# Patient Record
Sex: Female | Born: 1950
Health system: Southern US, Community
[De-identification: ages and names within clinical notes are randomized; demographics above are authoritative.]

## PROBLEM LIST (undated history)

## (undated) DIAGNOSIS — J45909 Unspecified asthma, uncomplicated: Secondary | ICD-10-CM

## (undated) DIAGNOSIS — I10 Essential (primary) hypertension: Secondary | ICD-10-CM

## (undated) DIAGNOSIS — M109 Gout, unspecified: Secondary | ICD-10-CM

## (undated) DIAGNOSIS — M1990 Unspecified osteoarthritis, unspecified site: Secondary | ICD-10-CM

## (undated) DIAGNOSIS — J189 Pneumonia, unspecified organism: Secondary | ICD-10-CM

## (undated) HISTORY — DX: Unspecified osteoarthritis, unspecified site: M19.90

---

## 1986-11-27 ENCOUNTER — Encounter (INDEPENDENT_AMBULATORY_CARE_PROVIDER_SITE_OTHER): Payer: Self-pay | Admitting: *Deleted

## 2000-11-25 ENCOUNTER — Emergency Department (HOSPITAL_COMMUNITY): Admission: EM | Admit: 2000-11-25 | Discharge: 2000-11-25 | Payer: Self-pay | Admitting: Emergency Medicine

## 2000-11-25 ENCOUNTER — Encounter: Payer: Self-pay | Admitting: Emergency Medicine

## 2001-08-28 ENCOUNTER — Emergency Department (HOSPITAL_COMMUNITY): Admission: EM | Admit: 2001-08-28 | Discharge: 2001-08-28 | Payer: Self-pay | Admitting: Emergency Medicine

## 2001-08-28 ENCOUNTER — Encounter: Payer: Self-pay | Admitting: Emergency Medicine

## 2001-10-27 ENCOUNTER — Emergency Department (HOSPITAL_COMMUNITY): Admission: EM | Admit: 2001-10-27 | Discharge: 2001-10-27 | Payer: Self-pay | Admitting: Emergency Medicine

## 2002-01-01 ENCOUNTER — Encounter: Admission: RE | Admit: 2002-01-01 | Discharge: 2002-01-01 | Payer: Self-pay | Admitting: Family Medicine

## 2005-04-18 ENCOUNTER — Emergency Department (HOSPITAL_COMMUNITY): Admission: EM | Admit: 2005-04-18 | Discharge: 2005-04-18 | Payer: Self-pay | Admitting: Family Medicine

## 2006-11-01 ENCOUNTER — Emergency Department (HOSPITAL_COMMUNITY): Admission: EM | Admit: 2006-11-01 | Discharge: 2006-11-01 | Payer: Self-pay | Admitting: Emergency Medicine

## 2006-11-23 ENCOUNTER — Emergency Department (HOSPITAL_COMMUNITY): Admission: EM | Admit: 2006-11-23 | Discharge: 2006-11-23 | Payer: Self-pay | Admitting: Emergency Medicine

## 2007-01-17 ENCOUNTER — Emergency Department (HOSPITAL_COMMUNITY): Admission: EM | Admit: 2007-01-17 | Discharge: 2007-01-17 | Payer: Self-pay | Admitting: Family Medicine

## 2007-01-25 ENCOUNTER — Encounter (INDEPENDENT_AMBULATORY_CARE_PROVIDER_SITE_OTHER): Payer: Self-pay | Admitting: *Deleted

## 2007-01-29 ENCOUNTER — Emergency Department (HOSPITAL_COMMUNITY): Admission: EM | Admit: 2007-01-29 | Discharge: 2007-01-29 | Payer: Self-pay | Admitting: Emergency Medicine

## 2009-02-17 ENCOUNTER — Emergency Department (HOSPITAL_COMMUNITY): Admission: EM | Admit: 2009-02-17 | Discharge: 2009-02-17 | Payer: Self-pay | Admitting: Emergency Medicine

## 2010-04-25 ENCOUNTER — Emergency Department (HOSPITAL_COMMUNITY): Admission: EM | Admit: 2010-04-25 | Discharge: 2010-04-25 | Payer: Self-pay | Admitting: Emergency Medicine

## 2010-05-28 ENCOUNTER — Emergency Department (HOSPITAL_COMMUNITY): Admission: EM | Admit: 2010-05-28 | Discharge: 2010-05-28 | Payer: Self-pay | Admitting: Emergency Medicine

## 2011-03-09 LAB — POCT I-STAT, CHEM 8
BUN: 13 mg/dL (ref 6–23)
Creatinine, Ser: 0.9 mg/dL (ref 0.4–1.2)
Glucose, Bld: 124 mg/dL — ABNORMAL HIGH (ref 70–99)
HCT: 41 % (ref 36.0–46.0)
Hemoglobin: 13.9 g/dL (ref 12.0–15.0)
Potassium: 4.3 mEq/L (ref 3.5–5.1)
Sodium: 137 mEq/L (ref 135–145)

## 2011-10-09 ENCOUNTER — Emergency Department (INDEPENDENT_AMBULATORY_CARE_PROVIDER_SITE_OTHER)
Admission: EM | Admit: 2011-10-09 | Discharge: 2011-10-09 | Disposition: A | Discharge status: Home / Self Care | Payer: Self-pay | Source: Home / Self Care | Attending: Emergency Medicine | Admitting: Emergency Medicine

## 2011-10-09 ENCOUNTER — Encounter (HOSPITAL_COMMUNITY): Payer: Self-pay

## 2011-10-09 DIAGNOSIS — R51 Headache: Secondary | ICD-10-CM

## 2011-10-09 DIAGNOSIS — I10 Essential (primary) hypertension: Secondary | ICD-10-CM

## 2011-10-09 HISTORY — DX: Essential (primary) hypertension: I10

## 2011-10-09 MED ORDER — TRIAMTERENE-HCTZ 37.5-25 MG PO TABS: 1.0000 | ORAL_TABLET | Freq: Every day | ORAL | Status: DC

## 2011-10-09 MED ORDER — FUROSEMIDE 20 MG PO TABS: 20.0000 mg | ORAL_TABLET | Freq: Every day | ORAL | Status: DC

## 2011-10-09 MED ORDER — FUROSEMIDE 20 MG PO TABS: 20.0000 mg | ORAL_TABLET | Freq: Two times a day (BID) | ORAL | Status: DC

## 2011-10-09 MED ORDER — CLONIDINE HCL 0.1 MG PO TABS
0.1000 mg | ORAL_TABLET | Freq: Once | ORAL | Status: AC
  Administered 2011-10-09: 0.1 mg via ORAL

## 2011-10-09 MED ORDER — CLONIDINE HCL 0.1 MG PO TABS
ORAL_TABLET | ORAL | Status: AC
  Filled 2011-10-09: qty 1

## 2011-10-09 MED ORDER — TRAZODONE HCL 100 MG PO TABS: 100.0000 mg | ORAL_TABLET | Freq: Every day | ORAL | Status: DC

## 2011-10-09 MED ORDER — METOPROLOL TARTRATE 100 MG PO TABS: 100.0000 mg | ORAL_TABLET | Freq: Two times a day (BID) | ORAL | Status: DC

## 2011-10-09 NOTE — ED Notes (Signed)
Headache started 2 days ago .  Ran out of hypertension medications 2 days ago.  Denies any nausea or light sensitivity.

## 2011-10-09 NOTE — ED Provider Notes (Signed)
History     CSN: [id number] Arrival date & time: 10/09/2011  1:16 PM   First MD Initiated Contact with Patient 10/09/11 1332      Chief Complaint  Patient presents with  . Headache    2 days ago started.  Ran out of hypertension medications 2 days ago.      (Consider location/radiation/quality/duration/timing/severity/associated sxs/prior treatment) Patient is a 60 y.o. female presenting with headaches. The history is provided by the patient.  Headache The primary symptoms include headaches. Primary symptoms do not include loss of consciousness, altered mental status, dizziness, visual change, paresthesias, focal weakness, loss of sensation, memory loss, fever or nausea. The symptoms began 1 to 2 hours ago.  The headache is not associated with visual change, neck stiffness, paresthesias or weakness.  Additional symptoms include pain. Additional symptoms do not include neck stiffness or weakness. Medical issues also include hypertension.    Past Medical History  Diagnosis Date  . Hypertension     Past Surgical History  Procedure Date  . Cesarean section     History reviewed. No pertinent family history.  History  Substance Use Topics  . Smoking status: Never Smoker   . Smokeless tobacco: Never Used  . Alcohol Use: No    OB History    Grav Para Term Preterm Abortions TAB SAB Ect Mult Living                  Review of Systems  Constitutional: Negative.  Negative for fever.  HENT: Negative for neck stiffness.   Eyes: Negative for visual disturbance.  Respiratory: Negative for cough, chest tightness and shortness of breath.   Cardiovascular: Negative for chest pain, palpitations and leg swelling.  Gastrointestinal: Negative for nausea.  Neurological: Positive for headaches. Negative for dizziness, focal weakness, loss of consciousness, weakness, numbness and paresthesias.  Psychiatric/Behavioral: Negative for memory loss and altered mental status.    Allergies    Review of patient's allergies indicates no known allergies.  Home Medications   Current Outpatient Rx  Name Route Sig Dispense Refill  . ASPIRIN 325 MG PO TABS Oral Take 325 mg by mouth daily.      . FUROSEMIDE 20 MG PO TABS Oral Take 20 mg by mouth daily.      Marland Kitchen METOPROLOL TARTRATE 100 MG PO TABS Oral Take 100 mg by mouth 2 (two) times daily.      . TRAZODONE HCL 100 MG PO TABS Oral Take 100 mg by mouth at bedtime.      . TRIAMTERENE-HCTZ 37.5-25 MG PO TABS Oral Take 1 tablet by mouth daily.      . FUROSEMIDE 20 MG PO TABS Oral Take 1 tablet (20 mg total) by mouth 2 (two) times daily. 30 tablet 0  . METOPROLOL TARTRATE 100 MG PO TABS Oral Take 1 tablet (100 mg total) by mouth 2 (two) times daily. 60 tablet 0  . TRAZODONE HCL 100 MG PO TABS Oral Take 1 tablet (100 mg total) by mouth at bedtime. 30 tablet 0  . TRIAMTERENE-HCTZ 37.5-25 MG PO TABS Oral Take 1 each (1 tablet total) by mouth daily. 30 tablet 0    BP 136/83  Pulse 71  Temp(Src) 98.7 F (37.1 C) (Oral)  Resp 18  SpO2 100%  Physical Exam  Nursing note and vitals reviewed. Constitutional: She is oriented to person, place, and time. She appears well-developed and well-nourished. She does not appear ill. No distress.  Eyes: Pupils are equal, round, and reactive to light.  Neck: Normal range of motion.  Cardiovascular: Normal rate.   Pulmonary/Chest: Effort normal and breath sounds normal.  Neurological: She is alert and oriented to person, place, and time. No cranial nerve deficit. Coordination normal.  Skin: Skin is warm.    ED Course  Procedures (including critical care time)  Labs Reviewed - No data to display No results found.   1. Hypertension   2. Headache       MDM  Requesting medication refills- HA and mild nausea- off medicines x 2 days including metoprolol. No focal neurological abnormalities.        Jimmie Molly, MD 10/09/11 [phone]

## 2011-12-23 ENCOUNTER — Ambulatory Visit (INDEPENDENT_AMBULATORY_CARE_PROVIDER_SITE_OTHER): Payer: Self-pay

## 2011-12-23 DIAGNOSIS — I10 Essential (primary) hypertension: Secondary | ICD-10-CM

## 2011-12-26 ENCOUNTER — Encounter: Payer: Self-pay | Admitting: *Deleted

## 2012-05-14 ENCOUNTER — Other Ambulatory Visit: Payer: Self-pay | Admitting: Emergency Medicine

## 2012-06-21 ENCOUNTER — Ambulatory Visit: Payer: Self-pay | Admitting: Emergency Medicine

## 2012-06-21 ENCOUNTER — Ambulatory Visit: Payer: Self-pay

## 2012-06-21 VITALS — BP 142/82 | HR 72 | Temp 98.1°F | Resp 17 | Ht 64.0 in | Wt 226.0 lb

## 2012-06-21 DIAGNOSIS — M5430 Sciatica, unspecified side: Secondary | ICD-10-CM

## 2012-06-21 MED ORDER — CYCLOBENZAPRINE HCL 10 MG PO TABS: 10.0000 mg | ORAL_TABLET | Freq: Three times a day (TID) | ORAL | Status: AC | PRN

## 2012-06-21 MED ORDER — TRAMADOL HCL 50 MG PO TABS: 50.0000 mg | ORAL_TABLET | Freq: Four times a day (QID) | ORAL | Status: AC | PRN

## 2012-06-21 MED ORDER — NAPROXEN SODIUM 550 MG PO TABS: 550.0000 mg | ORAL_TABLET | Freq: Two times a day (BID) | ORAL | Status: DC
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
## 2012-06-21 NOTE — Patient Instructions (Signed)

## 2012-06-28 ENCOUNTER — Telehealth: Payer: Self-pay

## 2012-06-28 NOTE — Telephone Encounter (Signed)
Pt called back she is requesting refills on Metoprolol 100  Mg bid, Maxzide 37.5/25, Albuterol inhaler (not pro air it was too costly), Triamcinolone ointment and Lasix 20mg  bid please advise. I am pulling chart to put on PA desk for review

## 2012-06-28 NOTE — Telephone Encounter (Signed)
Pt is needing a refill on all her meds please call [phone]  dfb  Pharmacy is [facility]

## 2012-06-28 NOTE — Telephone Encounter (Signed)
I spoke to patient she is requesting Rx I need to know what she is in need of, she does not know but will call me back and let me know.

## 2012-06-29 MED ORDER — METOPROLOL TARTRATE 100 MG PO TABS: 100.0000 mg | ORAL_TABLET | Freq: Two times a day (BID) | ORAL | Status: DC

## 2012-06-29 MED ORDER — ALBUTEROL SULFATE HFA 108 (90 BASE) MCG/ACT IN AERS: 2.0000 | INHALATION_SPRAY | Freq: Four times a day (QID) | RESPIRATORY_TRACT | Status: DC | PRN

## 2012-06-29 MED ORDER — FUROSEMIDE 20 MG PO TABS: 20.0000 mg | ORAL_TABLET | Freq: Two times a day (BID) | ORAL | Status: DC

## 2012-06-29 MED ORDER — TRIAMCINOLONE ACETONIDE 0.5 % EX OINT: TOPICAL_OINTMENT | Freq: Two times a day (BID) | CUTANEOUS | Status: DC

## 2012-06-29 MED ORDER — TRIAMTERENE-HCTZ 37.5-25 MG PO TABS: 1.0000 | ORAL_TABLET | Freq: Every day | ORAL | Status: DC

## 2012-06-29 NOTE — Telephone Encounter (Signed)
Spoke with pt advised RX sent to pharmacy and she needs OV before she runs out. Pt understood

## 2012-06-29 NOTE — Telephone Encounter (Signed)
I sent meds over - she will need an ov before these run out.

## 2012-07-27 ENCOUNTER — Ambulatory Visit (INDEPENDENT_AMBULATORY_CARE_PROVIDER_SITE_OTHER): Payer: Self-pay | Admitting: Emergency Medicine

## 2012-07-27 VITALS — BP 162/104 | HR 75 | Temp 98.3°F | Resp 20 | Ht 64.0 in | Wt 227.4 lb

## 2012-07-27 DIAGNOSIS — M5430 Sciatica, unspecified side: Secondary | ICD-10-CM

## 2012-07-27 DIAGNOSIS — I10 Essential (primary) hypertension: Secondary | ICD-10-CM

## 2012-07-27 MED ORDER — FUROSEMIDE 20 MG PO TABS: 20.0000 mg | ORAL_TABLET | Freq: Two times a day (BID) | ORAL | Status: DC

## 2012-07-27 MED ORDER — ALBUTEROL SULFATE HFA 108 (90 BASE) MCG/ACT IN AERS: 2.0000 | INHALATION_SPRAY | Freq: Four times a day (QID) | RESPIRATORY_TRACT | Status: DC | PRN

## 2012-07-27 MED ORDER — CALCIUM CARBONATE 200 MG PO CAPS: 250.0000 mg | ORAL_CAPSULE | Freq: Two times a day (BID) | ORAL | Status: DC

## 2012-07-27 MED ORDER — TRIAMTERENE-HCTZ 37.5-25 MG PO TABS: 1.0000 | ORAL_TABLET | Freq: Every day | ORAL | Status: DC

## 2012-07-27 MED ORDER — TRAZODONE HCL 100 MG PO TABS: 100.0000 mg | ORAL_TABLET | Freq: Every day | ORAL | Status: DC

## 2012-07-27 MED ORDER — NAPROXEN SODIUM 550 MG PO TABS: 550.0000 mg | ORAL_TABLET | Freq: Two times a day (BID) | ORAL | Status: AC

## 2012-07-27 MED ORDER — ASPIRIN 325 MG PO TABS: 325.0000 mg | ORAL_TABLET | Freq: Every day | ORAL | Status: DC

## 2012-07-27 MED ORDER — OMEGA-3 FATTY ACIDS 1000 MG PO CAPS: 2.0000 g | ORAL_CAPSULE | Freq: Every day | ORAL | Status: DC

## 2012-07-27 MED ORDER — TRAMADOL HCL 50 MG PO TABS: 50.0000 mg | ORAL_TABLET | Freq: Four times a day (QID) | ORAL | Status: AC | PRN

## 2012-07-27 MED ORDER — TRIAMCINOLONE ACETONIDE 0.5 % EX OINT: TOPICAL_OINTMENT | Freq: Two times a day (BID) | CUTANEOUS | Status: AC

## 2012-07-27 MED ORDER — METOPROLOL TARTRATE 100 MG PO TABS: 100.0000 mg | ORAL_TABLET | Freq: Two times a day (BID) | ORAL | Status: DC

## 2012-07-27 NOTE — Progress Notes (Signed)
Date:  07/27/2012   Name:  Christine Porter   DOB:  04-05-1951   MRN:  [id number] Gender: female Age: 61 y.o.  PCP:  Elvina Sidle, MD    Chief Complaint: Hypertension   History of Present Illness:  Christine Porter is a 61 y.o. pleasant patient who presents with the following:  Just took her last dose of antihypertensive today.  Returned from [place] trip and thinks her BP is elevated as a consequence of the trip.  Denies symptoms, neurological or visual symptoms.  Has increased pain in sciatic distribution requiring naproxyn twice weekly  There is no problem list on file for this patient.   Past Medical History  Diagnosis Date  . Hypertension     Past Surgical History  Procedure Date  . Cesarean section     History  Substance Use Topics  . Smoking status: Never Smoker   . Smokeless tobacco: Never Used  . Alcohol Use: No    No family history on file.  No Known Allergies  Medication list has been reviewed and updated.  Current Outpatient Prescriptions on File Prior to Visit  Medication Sig Dispense Refill  . albuterol (PROVENTIL HFA;VENTOLIN HFA) 108 (90 BASE) MCG/ACT inhaler Inhale 2 puffs into the lungs every 6 (six) hours as needed for wheezing.  1 Inhaler  0  . aspirin 325 MG tablet Take 325 mg by mouth daily.        . calcium carbonate 200 MG capsule Take 250 mg by mouth 2 (two) times daily with a meal.      . fish oil-omega-3 fatty acids 1000 MG capsule Take 2 g by mouth daily.      . furosemide (LASIX) 20 MG tablet Take 1 tablet (20 mg total) by mouth 2 (two) times daily.  60 tablet  0  . metoprolol (LOPRESSOR) 100 MG tablet Take 1 tablet (100 mg total) by mouth 2 (two) times daily.  60 tablet  0  . naproxen sodium (ANAPROX DS) 550 MG tablet Take 1 tablet (550 mg total) by mouth 2 (two) times daily with a meal.  40 tablet  0  . traZODone (DESYREL) 100 MG tablet Take 100 mg by mouth at bedtime.        . triamcinolone ointment (KENALOG) 0.5 % Apply  topically 2 (two) times daily.  30 g  0  . triamterene-hydrochlorothiazide (MAXZIDE-25) 37.5-25 MG per tablet Take 1 each (1 tablet total) by mouth daily.  30 tablet  0  . DISCONTD: furosemide (LASIX) 20 MG tablet Take 1 tablet (20 mg total) by mouth daily.  30 tablet  0  . DISCONTD: furosemide (LASIX) 20 MG tablet Take 1 tablet (20 mg total) by mouth 2 (two) times daily.  30 tablet  0  . DISCONTD: metoprolol (LOPRESSOR) 100 MG tablet Take 100 mg by mouth 2 (two) times daily.        Marland Kitchen DISCONTD: metoprolol (LOPRESSOR) 100 MG tablet Take 1 tablet (100 mg total) by mouth 2 (two) times daily.  60 tablet  0  . DISCONTD: traZODone (DESYREL) 100 MG tablet Take 1 tablet (100 mg total) by mouth at bedtime.  30 tablet  0  . DISCONTD: triamterene-hydrochlorothiazide (MAXZIDE-25) 37.5-25 MG per tablet Take 1 each (1 tablet total) by mouth daily.  30 tablet  0  . DISCONTD: triamterene-hydrochlorothiazide (MAXZIDE-25) 37.5-25 MG per tablet Take 1 each (1 tablet total) by mouth daily.  30 tablet  0    Review of Systems:  As per  HPI, otherwise negative.    Physical Examination: Filed Vitals:   07/27/12 1248  BP: 162/104  Pulse: 75  Temp: 98.3 F (36.8 C)  Resp: 20   Filed Vitals:   07/27/12 1248  Height: 5\' 4"  (1.626 m)  Weight: 227 lb 6.4 oz (103.148 kg)   Body mass index is 39.03 kg/(m^2). Ideal Body Weight: Weight in (lb) to have BMI = 25: 145.3   GEN: WDWN, NAD, Non-toxic, A & O x 3 HEENT: Atraumatic, Normocephalic. Neck supple. No masses, No LAD. Ears and Nose: No external deformity. CV: RRR, No M/G/R. No JVD. No thrill. No extra heart sounds. PULM: CTA B, no wheezes, crackles, rhonchi. No retractions. No resp. distress. No accessory muscle use. ABD: S, NT, ND, +BS. No rebound. No HSM. EXTR: No c/c/e NEURO Normal gait.  .    Assessment and Plan: Hypertension Sciatic neuritis Refill meds Discussed MRI with patient and she refused  Carmelina Dane, MD

## 2014-05-19 ENCOUNTER — Emergency Department (HOSPITAL_COMMUNITY): Payer: Self-pay

## 2014-05-19 ENCOUNTER — Emergency Department (HOSPITAL_COMMUNITY)
Admission: EM | Admit: 2014-05-19 | Discharge: 2014-05-19 | Disposition: A | Discharge status: Home / Self Care | Payer: Self-pay | Attending: Emergency Medicine | Admitting: Emergency Medicine

## 2014-05-19 ENCOUNTER — Encounter (HOSPITAL_COMMUNITY): Payer: Self-pay | Admitting: Emergency Medicine

## 2014-05-19 DIAGNOSIS — I10 Essential (primary) hypertension: Secondary | ICD-10-CM | POA: Insufficient documentation

## 2014-05-19 DIAGNOSIS — IMO0002 Reserved for concepts with insufficient information to code with codable children: Secondary | ICD-10-CM | POA: Insufficient documentation

## 2014-05-19 DIAGNOSIS — Z79899 Other long term (current) drug therapy: Secondary | ICD-10-CM | POA: Insufficient documentation

## 2014-05-19 DIAGNOSIS — Z8701 Personal history of pneumonia (recurrent): Secondary | ICD-10-CM | POA: Insufficient documentation

## 2014-05-19 DIAGNOSIS — J45901 Unspecified asthma with (acute) exacerbation: Secondary | ICD-10-CM | POA: Insufficient documentation

## 2014-05-19 HISTORY — DX: Pneumonia, unspecified organism: J18.9

## 2014-05-19 LAB — BASIC METABOLIC PANEL
BUN: 11 mg/dL (ref 6–23)
CALCIUM: 9.4 mg/dL (ref 8.4–10.5)
CO2: 28 meq/L (ref 19–32)
Chloride: 105 mEq/L (ref 96–112)
Creatinine, Ser: 0.73 mg/dL (ref 0.50–1.10)
GFR calc Af Amer: >90 mL/min (ref >90)
GFR, EST NON AFRICAN AMERICAN: 88 mL/min — AB (ref >90)
Glucose, Bld: 90 mg/dL (ref 70–99)
Potassium: 3.8 mEq/L (ref 3.7–5.3)
SODIUM: 145 meq/L (ref 137–147)

## 2014-05-19 LAB — CBC
HCT: 33.6 % — ABNORMAL LOW (ref 36.0–46.0)
Hemoglobin: 11.1 g/dL — ABNORMAL LOW (ref 12.0–15.0)
MCH: 25.7 pg — ABNORMAL LOW (ref 26.0–34.0)
MCHC: 33 g/dL (ref 30.0–36.0)
MCV: 77.8 fL — ABNORMAL LOW (ref 78.0–100.0)
PLATELETS: 273 10*3/uL (ref 150–400)
RBC: 4.32 MIL/uL (ref 3.87–5.11)
RDW: 14.6 % (ref 11.5–15.5)
WBC: 5 10*3/uL (ref 4.0–10.5)

## 2014-05-19 MED ORDER — METOPROLOL TARTRATE 25 MG PO TABS
100.0000 mg | ORAL_TABLET | Freq: Once | ORAL | Status: AC
  Administered 2014-05-19: 100 mg via ORAL
  Filled 2014-05-19: qty 4

## 2014-05-19 MED ORDER — PREDNISONE 50 MG PO TABS: 50.0000 mg | ORAL_TABLET | Freq: Every day | ORAL | Status: DC

## 2014-05-19 MED ORDER — IPRATROPIUM BROMIDE 0.02 % IN SOLN
0.5000 mg | Freq: Once | RESPIRATORY_TRACT | Status: AC
  Administered 2014-05-19: 0.5 mg via RESPIRATORY_TRACT
  Filled 2014-05-19: qty 2.5

## 2014-05-19 MED ORDER — ALBUTEROL SULFATE (2.5 MG/3ML) 0.083% IN NEBU
5.0000 mg | INHALATION_SOLUTION | Freq: Once | RESPIRATORY_TRACT | Status: AC
  Administered 2014-05-19: 5 mg via RESPIRATORY_TRACT
  Filled 2014-05-19: qty 6

## 2014-05-19 MED ORDER — ALBUTEROL SULFATE (2.5 MG/3ML) 0.083% IN NEBU: 2.5000 mg | INHALATION_SOLUTION | Freq: Four times a day (QID) | RESPIRATORY_TRACT | Status: DC | PRN

## 2014-05-19 MED ORDER — SODIUM CHLORIDE 0.9 % IV BOLUS (SEPSIS)
500.0000 mL | Freq: Once | INTRAVENOUS | Status: AC
  Administered 2014-05-19: 500 mL via INTRAVENOUS

## 2014-05-19 MED ORDER — METHYLPREDNISOLONE SODIUM SUCC 125 MG IJ SOLR
125.0000 mg | Freq: Once | INTRAMUSCULAR | Status: AC
  Administered 2014-05-19: 125 mg via INTRAVENOUS
  Filled 2014-05-19: qty 2

## 2014-05-19 NOTE — ED Notes (Signed)
Pt transported to xray; xray department called and spoke with Brandy to let transporter know where to take pt back to

## 2014-05-19 NOTE — ED Provider Notes (Signed)
CSN: [id number]     Arrival date & time 05/19/14  1244 History   First MD Initiated Contact with Patient 05/19/14 1306     Chief Complaint  Patient presents with  . Shortness of Breath     (Consider location/radiation/quality/duration/timing/severity/associated sxs/prior Treatment) HPI Pt presents with c/o cough, shortness of breath.  Cough is productive of mucous.  No fever, but has had some subjective chills.  No chest pain, but does have some pain in her ribs with coughing. No leg swelling.  Pt has been using her albuterol inhaler at home without much relief.  Symptoms have been gradually worsening over the past several days.  No recent travel.  Pt denies smoking.  No specific sick contacts.  There are no other associated systemic symptoms, there are no other alleviating or modifying factors.   Past Medical History  Diagnosis Date  . Hypertension   . Pneumonia    Past Surgical History  Procedure Laterality Date  . Cesarean section     History reviewed. No pertinent family history. History  Substance Use Topics  . Smoking status: Never Smoker   . Smokeless tobacco: Never Used  . Alcohol Use: No   OB History   Grav Para Term Preterm Abortions TAB SAB Ect Mult Living                 Review of Systems ROS reviewed and all otherwise negative except for mentioned in HPI    Allergies  Codeine  Home Medications   Prior to Admission medications   Medication Sig Start Date End Date Taking? Authorizing Provider  albuterol (PROVENTIL HFA;VENTOLIN HFA) 108 (90 BASE) MCG/ACT inhaler Inhale 1 puff into the lungs every 6 (six) hours as needed for wheezing or shortness of breath.   Yes Historical Provider, MD  furosemide (LASIX) 40 MG tablet Take 40 mg by mouth daily as needed for fluid.    Yes Historical Provider, MD  metoprolol (LOPRESSOR) 100 MG tablet Take 100 mg by mouth 2 (two) times daily.   Yes Historical Provider, MD  traZODone (DESYREL) 100 MG tablet Take 1 tablet (100 mg  total) by mouth at bedtime. 07/27/12  Yes Ellison Carwin, MD  albuterol (PROVENTIL) (2.5 MG/3ML) 0.083% nebulizer solution Take 3 mLs (2.5 mg total) by nebulization every 6 (six) hours as needed for wheezing or shortness of breath. 05/19/14   Threasa Beards, MD  metoprolol (LOPRESSOR) 100 MG tablet Take 1 tablet (100 mg total) by mouth 2 (two) times daily. 07/27/12 07/27/13  Ellison Carwin, MD  predniSONE (DELTASONE) 50 MG tablet Take 1 tablet (50 mg total) by mouth daily. 05/19/14   Threasa Beards, MD   BP 193/91  Pulse 65  Temp(Src) 98.5 F (36.9 C) (Oral)  Resp 12  SpO2 100% Vitals reviewed Physical Exam Physical Examination: General appearance - alert, well appearing, and in no distress Mental status - alert, oriented to person, place, and time Eyes - no conjunctival injection, no scleral icterus Mouth - mucous membranes moist, pharynx normal without lesions Chest - symmetric air entry, bilateral expiratory wheezing, decreased air movement throughout, speaking in full sentences Heart - normal rate, regular rhythm, normal S1, S2, no murmurs, rubs, clicks or gallops Abdomen - soft, nontender, nondistended, no masses or organomegaly Extremities - peripheral pulses normal, no pedal edema, no clubbing or cyanosis Skin - normal coloration and turgor, no rashes  ED Course  Procedures (including critical care time)  2:53 PM pt feeling improved after neb treatment.  Still  has some very mild exp wheezes at bases.  Will give another duoneb.  Pt is requesting her home metoprolol dose as she has not taken that yet today.  This was ordered for her.   Labs Review Labs Reviewed  BASIC METABOLIC PANEL - Abnormal; Notable for the following:    GFR calc non Af Amer 88 (*)    All other components within normal limits  CBC - Abnormal; Notable for the following:    Hemoglobin 11.1 (*)    HCT 33.6 (*)    MCV 77.8 (*)    MCH 25.7 (*)    All other components within normal limits    Imaging  Review No results found.   EKG Interpretation None      MDM   Final diagnoses:  Asthma exacerbation  Essential hypertension    Pt presenting with wheezing, cough and shortness of breath.  Pt feels improved after 2 nebs in the ED.  Also started on steroids.  CXR does not show any pneumonia or other acute changes.   Xray images reviewed and interpreted by me as well.  Pt encouraged to use albuterol x4 hours, given rx for prednisone.  Discharged with strict return precautions.  Pt agreeable with plan.  Nursing notes including past medical history and social history reviewed and considered in documentation   Prior records reviewed and considered during this visit     Threasa Beards, MD 05/22/14 [phone]

## 2014-05-19 NOTE — ED Notes (Signed)
She states shes had a productive cough, chills, feeling SOB and pain in her ribs for past few days.

## 2014-05-19 NOTE — ED Notes (Signed)
Notified MD that pt's BP has been elevated. Pt missed home dose of metoprolol.

## 2014-05-19 NOTE — ED Notes (Signed)
Pt states she feels much better after receiving breathing treatment

## 2014-05-19 NOTE — ED Notes (Signed)
Pt still in radiology.

## 2014-05-19 NOTE — Discharge Instructions (Signed)
Return to the ED with any concerns including difficulty breathing despite using albuterol every 4 hours, not drinking fluids, decreased urine output, vomiting and not able to keep down liquids or medications, decreased level of alertness/lethargy, or any other alarming symptoms °

## 2014-05-19 NOTE — ED Notes (Signed)
Pt requesting a prescription for HTN. Denies currently taking anything at home for HTN.

## 2015-04-03 ENCOUNTER — Encounter (HOSPITAL_COMMUNITY): Payer: Self-pay | Admitting: *Deleted

## 2015-04-03 ENCOUNTER — Emergency Department (HOSPITAL_COMMUNITY)
Admission: EM | Admit: 2015-04-03 | Discharge: 2015-04-03 | Disposition: A | Discharge status: Home / Self Care | Payer: Medicare Other | Attending: Emergency Medicine | Admitting: Emergency Medicine

## 2015-04-03 ENCOUNTER — Other Ambulatory Visit: Payer: Self-pay

## 2015-04-03 DIAGNOSIS — Z8701 Personal history of pneumonia (recurrent): Secondary | ICD-10-CM | POA: Diagnosis not present

## 2015-04-03 DIAGNOSIS — Z7952 Long term (current) use of systemic steroids: Secondary | ICD-10-CM | POA: Insufficient documentation

## 2015-04-03 DIAGNOSIS — I10 Essential (primary) hypertension: Secondary | ICD-10-CM | POA: Diagnosis present

## 2015-04-03 DIAGNOSIS — Z79899 Other long term (current) drug therapy: Secondary | ICD-10-CM | POA: Insufficient documentation

## 2015-04-03 LAB — CBC WITH DIFFERENTIAL/PLATELET
Basophils Absolute: 0 10*3/uL (ref 0.0–0.1)
Basophils Relative: 0 % (ref 0–1)
Eosinophils Absolute: 0.1 10*3/uL (ref 0.0–0.7)
Eosinophils Relative: 1 % (ref 0–5)
HEMATOCRIT: 35.5 % — AB (ref 36.0–46.0)
HEMOGLOBIN: 11.7 g/dL — AB (ref 12.0–15.0)
LYMPHS ABS: 2.3 10*3/uL (ref 0.7–4.0)
Lymphocytes Relative: 35 % (ref 12–46)
MCH: 25.2 pg — ABNORMAL LOW (ref 26.0–34.0)
MCHC: 33 g/dL (ref 30.0–36.0)
MCV: 76.5 fL — AB (ref 78.0–100.0)
MONO ABS: 0.6 10*3/uL (ref 0.1–1.0)
MONOS PCT: 9 % (ref 3–12)
NEUTROS ABS: 3.7 10*3/uL (ref 1.7–7.7)
Neutrophils Relative %: 55 % (ref 43–77)
Platelets: 245 10*3/uL (ref 150–400)
RBC: 4.64 MIL/uL (ref 3.87–5.11)
RDW: 15 % (ref 11.5–15.5)
WBC: 6.6 10*3/uL (ref 4.0–10.5)

## 2015-04-03 LAB — URINE MICROSCOPIC-ADD ON

## 2015-04-03 LAB — COMPREHENSIVE METABOLIC PANEL
ALT: 9 U/L — ABNORMAL LOW (ref 14–54)
AST: 21 U/L (ref 15–41)
Albumin: 3.6 g/dL (ref 3.5–5.0)
Alkaline Phosphatase: 82 U/L (ref 38–126)
Anion gap: 6 (ref 5–15)
BILIRUBIN TOTAL: 0.6 mg/dL (ref 0.3–1.2)
BUN: 19 mg/dL (ref 6–20)
CHLORIDE: 100 mmol/L — AB (ref 101–111)
CO2: 27 mmol/L (ref 22–32)
Calcium: 9.3 mg/dL (ref 8.9–10.3)
Creatinine, Ser: 1.12 mg/dL — ABNORMAL HIGH (ref 0.44–1.00)
GFR, EST AFRICAN AMERICAN: 58 mL/min — AB (ref >60)
GFR, EST NON AFRICAN AMERICAN: 50 mL/min — AB (ref >60)
Glucose, Bld: 119 mg/dL — ABNORMAL HIGH (ref 70–99)
POTASSIUM: 3.2 mmol/L — AB (ref 3.5–5.1)
SODIUM: 133 mmol/L — AB (ref 135–145)
Total Protein: 7.1 g/dL (ref 6.5–8.1)

## 2015-04-03 LAB — URINALYSIS, ROUTINE W REFLEX MICROSCOPIC
GLUCOSE, UA: NEGATIVE mg/dL
Hgb urine dipstick: NEGATIVE
KETONES UR: 15 mg/dL — AB
Nitrite: NEGATIVE
PROTEIN: 30 mg/dL — AB
Specific Gravity, Urine: 1.018 (ref 1.005–1.030)
Urobilinogen, UA: 1 mg/dL (ref 0.0–1.0)
pH: 5 (ref 5.0–8.0)

## 2015-04-03 MED ORDER — POTASSIUM CHLORIDE ER 20 MEQ PO TBCR: EXTENDED_RELEASE_TABLET | ORAL | Status: DC

## 2015-04-03 MED ORDER — HYDROCHLOROTHIAZIDE 25 MG PO TABS: 25.0000 mg | ORAL_TABLET | Freq: Every day | ORAL | Status: DC

## 2015-04-03 MED ORDER — HYDROCHLOROTHIAZIDE 25 MG PO TABS
25.0000 mg | ORAL_TABLET | Freq: Every day | ORAL | Status: DC
  Administered 2015-04-03: 25 mg via ORAL
  Filled 2015-04-03: qty 1

## 2015-04-03 MED ORDER — SODIUM CHLORIDE 0.9 % IV BOLUS (SEPSIS)
1000.0000 mL | Freq: Once | INTRAVENOUS | Status: AC
  Administered 2015-04-03: 1000 mL via INTRAVENOUS

## 2015-04-03 NOTE — ED Notes (Signed)
Pt and daughter have several questions regarding concern of pts hypertension and discharge. This RN answered their questions to the best of my ability but they still request to speak with Dr. Lacinda Axon. Dr. Lacinda Axon and [name], PA informed. Magda Paganini to go back into room to answer questions and concerns.

## 2015-04-03 NOTE — ED Provider Notes (Signed)
CSN: [id number]     Arrival date & time 04/03/15  2006 History   First MD Initiated Contact with Patient 04/03/15 2042     Chief Complaint  Patient presents with  . Hypertension     (Consider location/radiation/quality/duration/timing/severity/associated sxs/prior Treatment) Patient is a 64 y.o. female presenting with hypertension. The history is provided by the patient. No language interpreter was used.  Hypertension This is a new problem. The current episode started today. The problem occurs constantly. The problem has been gradually worsening. Pertinent negatives include no abdominal pain or chest pain. Nothing aggravates the symptoms. She has tried nothing for the symptoms. The treatment provided moderate relief.  Pt complains of her blood pressure being high.  Pt reports she also thinks she is dehydrated.  Pt reports increased stress recently.  Pt takes metoprolol 100mg  bid  Past Medical History  Diagnosis Date  . Hypertension   . Pneumonia    Past Surgical History  Procedure Laterality Date  . Cesarean section     History reviewed. No pertinent family history. History  Substance Use Topics  . Smoking status: Never Smoker   . Smokeless tobacco: Never Used  . Alcohol Use: No   OB History    No data available     Review of Systems  Cardiovascular: Negative for chest pain.  Gastrointestinal: Negative for abdominal pain.  All other systems reviewed and are negative.     Allergies  Codeine  Home Medications   Prior to Admission medications   Medication Sig Start Date End Date Taking? Authorizing Provider  albuterol (PROVENTIL HFA;VENTOLIN HFA) 108 (90 BASE) MCG/ACT inhaler Inhale 1 puff into the lungs every 6 (six) hours as needed for wheezing or shortness of breath.    Historical Provider, MD  albuterol (PROVENTIL) (2.5 MG/3ML) 0.083% nebulizer solution Take 3 mLs (2.5 mg total) by nebulization every 6 (six) hours as needed for wheezing or shortness of breath.  05/19/14   Alfonzo Beers, MD  furosemide (LASIX) 40 MG tablet Take 40 mg by mouth daily as needed for fluid.     Historical Provider, MD  metoprolol (LOPRESSOR) 100 MG tablet Take 1 tablet (100 mg total) by mouth 2 (two) times daily. 07/27/12 07/27/13  Roselee Culver, MD  metoprolol (LOPRESSOR) 100 MG tablet Take 100 mg by mouth 2 (two) times daily.    Historical Provider, MD  predniSONE (DELTASONE) 50 MG tablet Take 1 tablet (50 mg total) by mouth daily. 05/19/14   Alfonzo Beers, MD  traZODone (DESYREL) 100 MG tablet Take 1 tablet (100 mg total) by mouth at bedtime. 07/27/12   Roselee Culver, MD   BP 205/91 mmHg  Pulse 76  Temp(Src) 98.4 F (36.9 C) (Oral)  Resp 20  Ht 5\' 4"  (1.626 m)  Wt 195 lb (88.451 kg)  BMI 33.46 kg/m2  SpO2 98% Physical Exam  Constitutional: She is oriented to person, place, and time. She appears well-developed and well-nourished.  HENT:  Head: Normocephalic and atraumatic.  Right Ear: External ear normal.  Left Ear: External ear normal.  Nose: Nose normal.  Mouth/Throat: Oropharynx is clear and moist.  Eyes: Conjunctivae and EOM are normal. Pupils are equal, round, and reactive to light.  Neck: Normal range of motion.  Cardiovascular: Normal rate and normal heart sounds.   Pulmonary/Chest: Effort normal.  Abdominal: Soft. She exhibits no distension.  Musculoskeletal: Normal range of motion.  Neurological: She is alert and oriented to person, place, and time.  Skin: Skin is warm.  Psychiatric:  She has a normal mood and affect.  Nursing note and vitals reviewed.   ED Course  Procedures (including critical care time) Labs Review Labs Reviewed - No data to display  Imaging Review No results found.   EKG Interpretation   Date/Time:  Saturday Apr 03 2015 20:21:40 EDT Ventricular Rate:  70 PR Interval:  136 QRS Duration: 80 QT Interval:  416 QTC Calculation: 449 R Axis:   25 Text Interpretation:  Normal sinus rhythm ST \\T \ T wave abnormality,   consider lateral ischemia Abnormal ECG Confirmed by COOK  MD, BRIAN  ([id number]) on 04/03/2015 10:00:21 PM      MDM  k 3.2  Creatine is 1,12  Bun normal.   Pt is given Iv fluids.   Pt had decreased blood pressure with hctz and relaxing.   Pt advised to follow up with her MD   Final diagnoses:  Essential hypertension    hctz rx Potassium rx Continue metoprolol. See Dt. Laurenstein for recheck next week.    Fransico Meadow, PA-C 04/03/15 [place], PA-C 04/03/15 [place], MD 04/04/15 [phone]

## 2015-04-03 NOTE — ED Notes (Signed)
Patient presents with c/o HTN  States she was checked on Thursday and it was up  States has been taking her BP meds   +lightheaded

## 2015-04-03 NOTE — Discharge Instructions (Signed)
DASH Eating Plan DASH stands for "Dietary Approaches to Stop Hypertension." The DASH eating plan is a healthy eating plan that has been shown to reduce high blood pressure (hypertension). Additional health benefits may include reducing the risk of type 2 diabetes mellitus, heart disease, and stroke. The DASH eating plan may also help with weight loss. WHAT DO I NEED TO KNOW ABOUT THE DASH EATING PLAN? For the DASH eating plan, you will follow these general guidelines:  Choose foods with a percent daily value for sodium of less than 5% (as listed on the food label).  Use salt-free seasonings or herbs instead of table salt or sea salt.  Check with your health care provider or pharmacist before using salt substitutes.  Eat lower-sodium products, often labeled as "lower sodium" or "no salt added."  Eat fresh foods.  Eat more vegetables, fruits, and low-fat dairy products.  Choose whole grains. Look for the word "whole" as the first word in the ingredient list.  Choose fish and skinless chicken or turkey more often than red meat. Limit fish, poultry, and meat to 6 oz (170 g) each day.  Limit sweets, desserts, sugars, and sugary drinks.  Choose heart-healthy fats.  Limit cheese to 1 oz (28 g) per day.  Eat more home-cooked food and less restaurant, buffet, and fast food.  Limit fried foods.  Cook foods using methods other than frying.  Limit canned vegetables. If you do use them, rinse them well to decrease the sodium.  When eating at a restaurant, ask that your food be prepared with less salt, or no salt if possible. WHAT FOODS CAN I EAT? Seek help from a dietitian for individual calorie needs. Grains Whole grain or whole wheat bread. Brown rice. Whole grain or whole wheat pasta. Quinoa, bulgur, and whole grain cereals. Low-sodium cereals. Corn or whole wheat flour tortillas. Whole grain cornbread. Whole grain crackers. Low-sodium crackers. Vegetables Fresh or frozen vegetables  (raw, steamed, roasted, or grilled). Low-sodium or reduced-sodium tomato and vegetable juices. Low-sodium or reduced-sodium tomato sauce and paste. Low-sodium or reduced-sodium canned vegetables.  Fruits All fresh, canned (in natural juice), or frozen fruits. Meat and Other Protein Products Ground beef (85% or leaner), grass-fed beef, or beef trimmed of fat. Skinless chicken or turkey. Ground chicken or turkey. Pork trimmed of fat. All fish and seafood. Eggs. Dried beans, peas, or lentils. Unsalted nuts and seeds. Unsalted canned beans. Dairy Low-fat dairy products, such as skim or 1% milk, 2% or reduced-fat cheeses, low-fat ricotta or cottage cheese, or plain low-fat yogurt. Low-sodium or reduced-sodium cheeses. Fats and Oils Tub margarines without trans fats. Light or reduced-fat mayonnaise and salad dressings (reduced sodium). Avocado. Safflower, olive, or canola oils. Natural peanut or almond butter. Other Unsalted popcorn and pretzels. The items listed above may not be a complete list of recommended foods or beverages. Contact your dietitian for more options. WHAT FOODS ARE NOT RECOMMENDED? Grains White bread. White pasta. White rice. Refined cornbread. Bagels and croissants. Crackers that contain trans fat. Vegetables Creamed or fried vegetables. Vegetables in a cheese sauce. Regular canned vegetables. Regular canned tomato sauce and paste. Regular tomato and vegetable juices. Fruits Dried fruits. Canned fruit in light or heavy syrup. Fruit juice. Meat and Other Protein Products Fatty cuts of meat. Ribs, chicken wings, bacon, sausage, bologna, salami, chitterlings, fatback, hot dogs, bratwurst, and packaged luncheon meats. Salted nuts and seeds. Canned beans with salt. Dairy Whole or 2% milk, cream, half-and-half, and cream cheese. Whole-fat or sweetened yogurt. Full-fat   cheeses or blue cheese. Nondairy creamers and whipped toppings. Processed cheese, cheese spreads, or cheese  curds. Condiments Onion and garlic salt, seasoned salt, table salt, and sea salt. Canned and packaged gravies. Worcestershire sauce. Tartar sauce. Barbecue sauce. Teriyaki sauce. Soy sauce, including reduced sodium. Steak sauce. Fish sauce. Oyster sauce. Cocktail sauce. Horseradish. Ketchup and mustard. Meat flavorings and tenderizers. Bouillon cubes. Hot sauce. Tabasco sauce. Marinades. Taco seasonings. Relishes. Fats and Oils Butter, stick margarine, lard, shortening, ghee, and bacon fat. Coconut, palm kernel, or palm oils. Regular salad dressings. Other Pickles and olives. Salted popcorn and pretzels. The items listed above may not be a complete list of foods and beverages to avoid. Contact your dietitian for more information. WHERE CAN I FIND MORE INFORMATION? National Heart, [employer], [employer]: www.nhlbi.nih.gov/health/health-topics/topics/dash/ Document Released: 11/02/2011 Document Revised: 03/30/2014 Document Reviewed: 09/17/2013 [employer] Patient Information 2015 [employer]. This information is not intended to replace advice given to you by your health care provider. Make sure you discuss any questions you have with your health care provider. Hypertension Hypertension, commonly called high blood pressure, is when the force of blood pumping through your arteries is too strong. Your arteries are the blood vessels that carry blood from your heart throughout your body. A blood pressure reading consists of a higher number over a lower number, such as 110/72. The higher number (systolic) is the pressure inside your arteries when your heart pumps. The lower number (diastolic) is the pressure inside your arteries when your heart relaxes. Ideally you want your blood pressure below 120/80. Hypertension forces your heart to work harder to pump blood. Your arteries may become narrow or stiff. Having hypertension puts you at risk for heart disease, stroke, and other problems.  RISK  FACTORS Some risk factors for high blood pressure are controllable. Others are not.  Risk factors you cannot control include:   Race. You may be at higher risk if you are African American.  Age. Risk increases with age.  Gender. Men are at higher risk than women before age 45 years. After age 65, women are at higher risk than men. Risk factors you can control include:  Not getting enough exercise or physical activity.  Being overweight.  Getting too much fat, sugar, calories, or salt in your diet.  Drinking too much alcohol. SIGNS AND SYMPTOMS Hypertension does not usually cause signs or symptoms. Extremely high blood pressure (hypertensive crisis) may cause headache, anxiety, shortness of breath, and nosebleed. DIAGNOSIS  To check if you have hypertension, your health care provider will measure your blood pressure while you are seated, with your arm held at the level of your heart. It should be measured at least twice using the same arm. Certain conditions can cause a difference in blood pressure between your right and left arms. A blood pressure reading that is higher than normal on one occasion does not mean that you need treatment. If one blood pressure reading is high, ask your health care provider about having it checked again. TREATMENT  Treating high blood pressure includes making lifestyle changes and possibly taking medicine. Living a healthy lifestyle can help lower high blood pressure. You may need to change some of your habits. Lifestyle changes may include:  Following the DASH diet. This diet is high in fruits, vegetables, and whole grains. It is low in salt, red meat, and added sugars.  Getting at least 2 hours of brisk physical activity every week.  Losing weight if necessary.  Not smoking.  Limiting   alcoholic beverages.  Learning ways to reduce stress. If lifestyle changes are not enough to get your blood pressure under control, your health care provider may  prescribe medicine. You may need to take more than one. Work closely with your health care provider to understand the risks and benefits. HOME CARE INSTRUCTIONS  Have your blood pressure rechecked as directed by your health care provider.   Take medicines only as directed by your health care provider. Follow the directions carefully. Blood pressure medicines must be taken as prescribed. The medicine does not work as well when you skip doses. Skipping doses also puts you at risk for problems.   Do not smoke.   Monitor your blood pressure at home as directed by your health care provider. SEEK MEDICAL CARE IF:   You think you are having a reaction to medicines taken.  You have recurrent headaches or feel dizzy.  You have swelling in your ankles.  You have trouble with your vision. SEEK IMMEDIATE MEDICAL CARE IF:  You develop a severe headache or confusion.  You have unusual weakness, numbness, or feel faint.  You have severe chest or abdominal pain.  You vomit repeatedly.  You have trouble breathing. MAKE SURE YOU:   Understand these instructions.  Will watch your condition.  Will get help right away if you are not doing well or get worse. Document Released: 11/13/2005 Document Revised: 03/30/2014 Document Reviewed: 09/05/2013 ExitCare Patient Information 2015 [employer]. This information is not intended to replace advice given to you by your health care provider. Make sure you discuss any questions you have with your health care provider.  

## 2015-04-03 NOTE — ED Notes (Signed)
Christine Porter at bedside addressing questions and concerns of patient and daughter. They state they feel much better about being discharged and thanking staff for her care. Pt A&OX4, ambulatory at d/c with steady gait, NAD but wheeled out to waiting room by this RN.

## 2015-04-03 NOTE — ED Notes (Signed)
Pt requesting BP medication prior to leaving. [name], [place] informed. Pts BP 175/95. No new orders.

## 2015-05-22 ENCOUNTER — Emergency Department (HOSPITAL_COMMUNITY)
Admission: EM | Admit: 2015-05-22 | Discharge: 2015-05-22 | Disposition: A | Discharge status: Home / Self Care | Payer: Medicare Other | Attending: Emergency Medicine | Admitting: Emergency Medicine

## 2015-05-22 ENCOUNTER — Encounter (HOSPITAL_COMMUNITY): Payer: Self-pay | Admitting: Physical Medicine and Rehabilitation

## 2015-05-22 DIAGNOSIS — Z8701 Personal history of pneumonia (recurrent): Secondary | ICD-10-CM | POA: Diagnosis not present

## 2015-05-22 DIAGNOSIS — Z760 Encounter for issue of repeat prescription: Secondary | ICD-10-CM | POA: Diagnosis not present

## 2015-05-22 DIAGNOSIS — Z7982 Long term (current) use of aspirin: Secondary | ICD-10-CM | POA: Diagnosis not present

## 2015-05-22 DIAGNOSIS — I10 Essential (primary) hypertension: Secondary | ICD-10-CM | POA: Diagnosis present

## 2015-05-22 DIAGNOSIS — Z79899 Other long term (current) drug therapy: Secondary | ICD-10-CM | POA: Insufficient documentation

## 2015-05-22 MED ORDER — HYDROCHLOROTHIAZIDE 25 MG PO TABS: 25.0000 mg | ORAL_TABLET | Freq: Every day | ORAL | Status: DC

## 2015-05-22 MED ORDER — METOPROLOL TARTRATE 100 MG PO TABS: 100.0000 mg | ORAL_TABLET | Freq: Two times a day (BID) | ORAL | Status: DC

## 2015-05-22 NOTE — ED Notes (Signed)
Pt reports she ran out of blood pressure medications x4 days ago. Reports elevated BP at home. Pt also reports headache. She is alert and oriented x4. NAD

## 2015-05-22 NOTE — Discharge Instructions (Signed)

## 2015-05-22 NOTE — ED Notes (Signed)
Pt reports she ran out of BP medications x4 days ago. Reports she is unable to see PCP, states headache at the time. Pt is alert and oriented x4. NAD

## 2015-05-26 NOTE — ED Provider Notes (Signed)
CSN: [id number]     Arrival date & time 05/22/15  3149 History   First MD Initiated Contact with Patient 05/22/15 1000     Chief Complaint  Patient presents with  . Hypertension     (Consider location/radiation/quality/duration/timing/severity/associated sxs/prior Treatment) HPI   64 year old female with hypertension. Resting prescriptions for her medications. Patient states that she sometimes gets headaches when her blood pressure is elevated, but denies currently. No chest pain or shortness of breath. No swelling. No urinary complaints. Patient has been without her blood pressure medications were partially the last 4 days.  Past Medical History  Diagnosis Date  . Hypertension   . Pneumonia    Past Surgical History  Procedure Laterality Date  . Cesarean section     No family history on file. History  Substance Use Topics  . Smoking status: Never Smoker   . Smokeless tobacco: Never Used  . Alcohol Use: No   OB History    No data available     Review of Systems  All systems reviewed and negative, other than as noted in HPI.   Allergies  Codeine  Home Medications   Prior to Admission medications   Medication Sig Start Date End Date Taking? Authorizing Provider  albuterol (PROVENTIL HFA;VENTOLIN HFA) 108 (90 BASE) MCG/ACT inhaler Inhale 1 puff into the lungs every 6 (six) hours as needed for wheezing or shortness of breath.    Historical Provider, MD  albuterol (PROVENTIL) (2.5 MG/3ML) 0.083% nebulizer solution Take 3 mLs (2.5 mg total) by nebulization every 6 (six) hours as needed for wheezing or shortness of breath. 05/19/14   Alfonzo Beers, MD  aspirin EC 325 MG tablet Take 325 mg by mouth daily.    Historical Provider, MD  furosemide (LASIX) 40 MG tablet Take 40 mg by mouth daily as needed for fluid.     Historical Provider, MD  hydrochlorothiazide (HYDRODIURIL) 25 MG tablet Take 1 tablet (25 mg total) by mouth daily. 05/22/15   Virgel Manifold, MD  metoprolol  (LOPRESSOR) 100 MG tablet Take 100 mg by mouth 2 (two) times daily.    Historical Provider, MD  metoprolol (LOPRESSOR) 100 MG tablet Take 1 tablet (100 mg total) by mouth 2 (two) times daily. 05/22/15 05/21/16  Virgel Manifold, MD  potassium chloride 20 MEQ TBCR One a day 04/03/15   Fransico Meadow, PA-C  predniSONE (DELTASONE) 50 MG tablet Take 1 tablet (50 mg total) by mouth daily. Patient not taking: Reported on 04/03/2015 05/19/14   Alfonzo Beers, MD  sertraline (ZOLOFT) 50 MG tablet Take 50 mg by mouth daily.    Historical Provider, MD  traZODone (DESYREL) 100 MG tablet Take 1 tablet (100 mg total) by mouth at bedtime. Patient not taking: Reported on 04/03/2015 07/27/12   Roselee Culver, MD   BP 192/99 mmHg  Pulse 99  Temp(Src) 98.2 F (36.8 C) (Oral)  Resp 18  SpO2 98% Physical Exam  Constitutional: She appears well-developed and well-nourished. No distress.  HENT:  Head: Normocephalic and atraumatic.  Eyes: Conjunctivae and EOM are normal. Pupils are equal, round, and reactive to light. Right eye exhibits no discharge. Left eye exhibits no discharge.  Neck: Neck supple.  Cardiovascular: Normal rate, regular rhythm and normal heart sounds.  Exam reveals no gallop and no friction rub.   No murmur heard. Pulmonary/Chest: Effort normal and breath sounds normal. No respiratory distress.  Abdominal: Soft. She exhibits no distension. There is no tenderness.  Musculoskeletal: She exhibits no edema or tenderness.  Lower extremities symmetric as compared to each other. No calf tenderness. Negative Homan's. No palpable cords.    Neurological: She is alert.  Skin: Skin is warm and dry.  Psychiatric: She has a normal mood and affect. Her behavior is normal. Thought content normal.  Nursing note and vitals reviewed.   ED Course  Procedures (including critical care time) Labs Review Labs Reviewed - No data to display  Imaging Review No results found.   EKG Interpretation None      MDM    Final diagnoses:  Essential hypertension  Medication refill    59 show female with asymptomatic hypertension. Prescriptions were provided. Needs to follow-up with PCP for further management. Return cautions were discussed.    Virgel Manifold, MD 05/26/15 [phone]

## 2015-08-22 ENCOUNTER — Emergency Department (HOSPITAL_COMMUNITY): Payer: Medicare Other

## 2015-08-22 ENCOUNTER — Emergency Department (HOSPITAL_COMMUNITY)
Admission: EM | Admit: 2015-08-22 | Discharge: 2015-08-22 | Disposition: A | Discharge status: Home / Self Care | Payer: Medicare Other | Attending: Emergency Medicine | Admitting: Emergency Medicine

## 2015-08-22 ENCOUNTER — Encounter (HOSPITAL_COMMUNITY): Payer: Self-pay | Admitting: Emergency Medicine

## 2015-08-22 DIAGNOSIS — Z8701 Personal history of pneumonia (recurrent): Secondary | ICD-10-CM | POA: Diagnosis not present

## 2015-08-22 DIAGNOSIS — I10 Essential (primary) hypertension: Secondary | ICD-10-CM | POA: Insufficient documentation

## 2015-08-22 DIAGNOSIS — Z79899 Other long term (current) drug therapy: Secondary | ICD-10-CM | POA: Insufficient documentation

## 2015-08-22 DIAGNOSIS — Z7982 Long term (current) use of aspirin: Secondary | ICD-10-CM | POA: Insufficient documentation

## 2015-08-22 DIAGNOSIS — R42 Dizziness and giddiness: Secondary | ICD-10-CM | POA: Diagnosis present

## 2015-08-22 LAB — COMPREHENSIVE METABOLIC PANEL
ALT: 7 U/L — ABNORMAL LOW (ref 14–54)
AST: 23 U/L (ref 15–41)
Albumin: 3.6 g/dL (ref 3.5–5.0)
Alkaline Phosphatase: 83 U/L (ref 38–126)
Anion gap: 7 (ref 5–15)
BUN: 9 mg/dL (ref 6–20)
CHLORIDE: 100 mmol/L — AB (ref 101–111)
CO2: 28 mmol/L (ref 22–32)
Calcium: 8.9 mg/dL (ref 8.9–10.3)
Creatinine, Ser: 0.86 mg/dL (ref 0.44–1.00)
GFR calc Af Amer: >60 mL/min (ref >60)
GFR calc non Af Amer: >60 mL/min (ref >60)
GLUCOSE: 87 mg/dL (ref 65–99)
POTASSIUM: 3.4 mmol/L — AB (ref 3.5–5.1)
Sodium: 135 mmol/L (ref 135–145)
Total Bilirubin: 0.7 mg/dL (ref 0.3–1.2)
Total Protein: 7.4 g/dL (ref 6.5–8.1)

## 2015-08-22 LAB — URINALYSIS, ROUTINE W REFLEX MICROSCOPIC
Bilirubin Urine: NEGATIVE
Glucose, UA: NEGATIVE mg/dL
Hgb urine dipstick: NEGATIVE
KETONES UR: NEGATIVE mg/dL
LEUKOCYTES UA: NEGATIVE
Nitrite: NEGATIVE
PH: 7 (ref 5.0–8.0)
Protein, ur: NEGATIVE mg/dL
SPECIFIC GRAVITY, URINE: 1.01 (ref 1.005–1.030)
Urobilinogen, UA: 1 mg/dL (ref 0.0–1.0)

## 2015-08-22 LAB — CBC WITH DIFFERENTIAL/PLATELET
Basophils Absolute: 0 10*3/uL (ref 0.0–0.1)
Basophils Relative: 1 %
Eosinophils Absolute: 0.1 10*3/uL (ref 0.0–0.7)
Eosinophils Relative: 2 %
HCT: 35.1 % — ABNORMAL LOW (ref 36.0–46.0)
Hemoglobin: 11.5 g/dL — ABNORMAL LOW (ref 12.0–15.0)
LYMPHS ABS: 1.9 10*3/uL (ref 0.7–4.0)
LYMPHS PCT: 44 %
MCH: 25.2 pg — AB (ref 26.0–34.0)
MCHC: 32.8 g/dL (ref 30.0–36.0)
MCV: 77 fL — AB (ref 78.0–100.0)
Monocytes Absolute: 0.3 10*3/uL (ref 0.1–1.0)
Monocytes Relative: 6 %
Neutro Abs: 2 10*3/uL (ref 1.7–7.7)
Neutrophils Relative %: 47 %
PLATELETS: 272 10*3/uL (ref 150–400)
RBC: 4.56 MIL/uL (ref 3.87–5.11)
RDW: 15.2 % (ref 11.5–15.5)
WBC: 4.3 10*3/uL (ref 4.0–10.5)

## 2015-08-22 MED ORDER — HYDROCHLOROTHIAZIDE 25 MG PO TABS
25.0000 mg | ORAL_TABLET | Freq: Every day | ORAL | Status: DC
  Administered 2015-08-22: 25 mg via ORAL
  Filled 2015-08-22: qty 1

## 2015-08-22 MED ORDER — SODIUM CHLORIDE 0.9 % IV BOLUS (SEPSIS)
1000.0000 mL | Freq: Once | INTRAVENOUS | Status: AC
  Administered 2015-08-22: 1000 mL via INTRAVENOUS

## 2015-08-22 MED ORDER — ONDANSETRON HCL 4 MG/2ML IJ SOLN
4.0000 mg | Freq: Once | INTRAMUSCULAR | Status: AC
  Administered 2015-08-22: 4 mg via INTRAVENOUS
  Filled 2015-08-22: qty 2

## 2015-08-22 MED ORDER — HYDROCHLOROTHIAZIDE 25 MG PO TABS: 25.0000 mg | ORAL_TABLET | Freq: Every day | ORAL | Status: DC

## 2015-08-22 MED ORDER — METOPROLOL TARTRATE 25 MG PO TABS
100.0000 mg | ORAL_TABLET | Freq: Once | ORAL | Status: AC
  Administered 2015-08-22: 100 mg via ORAL
  Filled 2015-08-22: qty 4

## 2015-08-22 MED ORDER — METOPROLOL TARTRATE 100 MG PO TABS: 100.0000 mg | ORAL_TABLET | Freq: Once | ORAL | Status: DC

## 2015-08-22 MED ORDER — POTASSIUM CHLORIDE CRYS ER 20 MEQ PO TBCR
40.0000 meq | EXTENDED_RELEASE_TABLET | Freq: Once | ORAL | Status: AC
  Administered 2015-08-22: 40 meq via ORAL
  Filled 2015-08-22: qty 2

## 2015-08-22 MED ORDER — ONDANSETRON 4 MG PO TBDP
4.0000 mg | ORAL_TABLET | Freq: Once | ORAL | Status: AC
  Administered 2015-08-22: 4 mg via ORAL
  Filled 2015-08-22: qty 1

## 2015-08-22 NOTE — ED Notes (Signed)
Pt. Stated, my BP  is up and Im so dizzy. This started yesterday.

## 2015-08-22 NOTE — Discharge Instructions (Signed)
You were evaluated in the ED today for your high blood pressure and dizziness. There is not appear to be an emergent cause for your symptoms at this time. Ear exam, labs and CT scan of her head were all reassuring. Please follow-up with your PCP in 2 days for reevaluation. Please take your blood pressure medications as prescribed. Return to ED for worsening symptoms.  Dizziness Dizziness is a common problem. It is a feeling of unsteadiness or light-headedness. You may feel like you are about to faint. Dizziness can lead to injury if you stumble or fall. A person of any age group can suffer from dizziness, but dizziness is more common in older adults. CAUSES  Dizziness can be caused by many different things, including:  Middle ear problems.  Standing for too long.  Infections.  An allergic reaction.  Aging.  An emotional response to something, such as the sight of blood.  Side effects of medicines.  Tiredness.  Problems with circulation or blood pressure.  Excessive use of alcohol or medicines, or illegal drug use.  Breathing too fast (hyperventilation).  An irregular heart rhythm (arrhythmia).  A low red blood cell count (anemia).  Pregnancy.  Vomiting, diarrhea, fever, or other illnesses that cause body fluid loss (dehydration).  Diseases or conditions such as Parkinson's disease, high blood pressure (hypertension), diabetes, and thyroid problems.  Exposure to extreme heat. DIAGNOSIS  Your health care provider will ask about your symptoms, perform a physical exam, and perform an electrocardiogram (ECG) to record the electrical activity of your heart. Your health care provider may also perform other heart or blood tests to determine the cause of your dizziness. These may include:  Transthoracic echocardiogram (TTE). During echocardiography, sound waves are used to evaluate how blood flows through your heart.  Transesophageal echocardiogram (TEE).  Cardiac monitoring.  This allows your health care provider to monitor your heart rate and rhythm in real time.  Holter monitor. This is a portable device that records your heartbeat and can help diagnose heart arrhythmias. It allows your health care provider to track your heart activity for several days if needed.  Stress tests by exercise or by giving medicine that makes the heart beat faster. TREATMENT  Treatment of dizziness depends on the cause of your symptoms and can vary greatly. HOME CARE INSTRUCTIONS   Drink enough fluids to keep your urine clear or pale yellow. This is especially important in very hot weather. In older adults, it is also important in cold weather.  Take your medicine exactly as directed if your dizziness is caused by medicines. When taking blood pressure medicines, it is especially important to get up slowly.  Rise slowly from chairs and steady yourself until you feel okay.  In the morning, first sit up on the side of the bed. When you feel okay, stand slowly while holding onto something until you know your balance is fine.  Move your legs often if you need to stand in one place for a long time. Tighten and relax your muscles in your legs while standing.  Have someone stay with you for 1-2 days if dizziness continues to be a problem. Do this until you feel you are well enough to stay alone. Have the person call your health care provider if he or she notices changes in you that are concerning.  Do not drive or use heavy machinery if you feel dizzy.  Do not drink alcohol. SEEK IMMEDIATE MEDICAL CARE IF:   Your dizziness or light-headedness gets  worse.  You feel nauseous or vomit.  You have problems talking, walking, or using your arms, hands, or legs.  You feel weak.  You are not thinking clearly or you have trouble forming sentences. It may take a friend or family member to notice this.  You have chest pain, abdominal pain, shortness of breath, or sweating.  Your vision  changes.  You notice any bleeding.  You have side effects from medicine that seems to be getting worse rather than better. MAKE SURE YOU:   Understand these instructions.  Will watch your condition.  Will get help right away if you are not doing well or get worse. Document Released: 05/09/2001 Document Revised: 11/18/2013 Document Reviewed: 06/02/2011 [facility] Patient Information 2015 [place], [place]. This information is not intended to replace advice given to you by your health care provider. Make sure you discuss any questions you have with your health care provider.  Managing Your High Blood Pressure Blood pressure is a measurement of how forceful your blood is pressing against the walls of the arteries. Arteries are muscular tubes within the circulatory system. Blood pressure does not stay the same. Blood pressure rises when you are active, excited, or nervous; and it lowers during sleep and relaxation. If the numbers measuring your blood pressure stay above normal most of the time, you are at risk for health problems. High blood pressure (hypertension) is a long-term (chronic) condition in which blood pressure is elevated. A blood pressure reading is recorded as two numbers, such as 120 over 80 (or 120/80). The first, higher number is called the systolic pressure. It is a measure of the pressure in your arteries as the heart beats. The second, lower number is called the diastolic pressure. It is a measure of the pressure in your arteries as the heart relaxes between beats.  Keeping your blood pressure in a normal range is important to your overall health and prevention of health problems, such as heart disease and stroke. When your blood pressure is uncontrolled, your heart has to work harder than normal. High blood pressure is a very common condition in adults because blood pressure tends to rise with age. Men and women are equally likely to have hypertension but at different times in life.  Before age 96, men are more likely to have hypertension. After 66 years of age, women are more likely to have it. Hypertension is especially common in African Americans. This condition often has no signs or symptoms. The cause of the condition is usually not known. Your caregiver can help you come up with a plan to keep your blood pressure in a normal, healthy range. BLOOD PRESSURE STAGES Blood pressure is classified into four stages: normal, prehypertension, stage 1, and stage 2. Your blood pressure reading will be used to determine what type of treatment, if any, is necessary. Appropriate treatment options are tied to these four stages:  Normal  Systolic pressure (mm Hg): below 120.  Diastolic pressure (mm Hg): below 80. Prehypertension  Systolic pressure (mm Hg): 120 to 139.  Diastolic pressure (mm Hg): 80 to 89. Stage1  Systolic pressure (mm Hg): 140 to 159.  Diastolic pressure (mm Hg): 90 to 99. Stage2  Systolic pressure (mm Hg): 160 or above.  Diastolic pressure (mm Hg): 100 or above. RISKS RELATED TO HIGH BLOOD PRESSURE Managing your blood pressure is an important responsibility. Uncontrolled high blood pressure can lead to:  A heart attack.  A stroke.  A weakened blood vessel (aneurysm).  Heart failure.  Kidney damage.  Eye damage.  Metabolic syndrome.  Memory and concentration problems. HOW TO MANAGE YOUR BLOOD PRESSURE Blood pressure can be managed effectively with lifestyle changes and medicines (if needed). Your caregiver will help you come up with a plan to bring your blood pressure within a normal range. Your plan should include the following: Education  Read all information provided by your caregivers about how to control blood pressure.  Educate yourself on the latest guidelines and treatment recommendations. New research is always being done to further define the risks and treatments for high blood pressure. Lifestylechanges  Control your  weight.  Avoid smoking.  Stay physically active.  Reduce the amount of salt in your diet.  Reduce stress.  Control any chronic conditions, such as high cholesterol or diabetes.  Reduce your alcohol intake. Medicines  Several medicines (antihypertensive medicines) are available, if needed, to bring blood pressure within a normal range. Communication  Review all the medicines you take with your caregiver because there may be side effects or interactions.  Talk with your caregiver about your diet, exercise habits, and other lifestyle factors that may be contributing to high blood pressure.  See your caregiver regularly. Your caregiver can help you create and adjust your plan for managing high blood pressure. RECOMMENDATIONS FOR TREATMENT AND FOLLOW-UP  The following recommendations are based on current guidelines for managing high blood pressure in nonpregnant adults. Use these recommendations to identify the proper follow-up period or treatment option based on your blood pressure reading. You can discuss these options with your caregiver.  Systolic pressure of 202 to 542 or diastolic pressure of 80 to 89: Follow up with your caregiver as directed.  Systolic pressure of 706 to 237 or diastolic pressure of 90 to 100: Follow up with your caregiver within 2 months.  Systolic pressure above 628 or diastolic pressure above 315: Follow up with your caregiver within 1 month.  Systolic pressure above 176 or diastolic pressure above 160: Consider antihypertensive therapy; follow up with your caregiver within 1 week.  Systolic pressure above 737 or diastolic pressure above 106: Begin antihypertensive therapy; follow up with your caregiver within 1 week. Document Released: 08/07/2012 Document Reviewed: 08/07/2012 [facility] Patient Information 2015 [place]. This information is not intended to replace advice given to you by your health care provider. Make sure you discuss any questions you  have with your health care provider.

## 2015-08-22 NOTE — ED Provider Notes (Signed)
CSN: [id number]     Arrival date & time 08/22/15  1050 History   First MD Initiated Contact with Patient 08/22/15 1124     Chief Complaint  Patient presents with  . Hypertension  . Dizziness     (Consider location/radiation/quality/duration/timing/severity/associated sxs/prior Treatment) HPI Christine Porter is a 64 y.o. female with history of hypertension, comes in for evaluation of high blood pressure and dizziness. Patient states she has been out of her blood pressure medications for "some time". She reports last night at approximately 9:00pm while cleaning up she began to feel dizzy and was unable to "put a key into the lock". Upon further questioning, patient admits it was dark outside, she used her flashlight to see the lock and was able to successfully put the key in the lock. She was able to go to sleep, but upon waking she still had the same dizzy sensation. She reports associated nausea without vomiting. Sensation is worse with standing as she will become slightly dizzy upon standing, but the sensation will resolve after a few seconds and she is able to walk without difficulty. She denies any associated headache, vision changes, chest pain, shortness of breath, abdominal pain. Reports that she is mildly dizzy now in the ED. Reports that she has a follow-up appointment with her PCP on Wednesday.  Past Medical History  Diagnosis Date  . Hypertension   . Pneumonia    Past Surgical History  Procedure Laterality Date  . Cesarean section     No family history on file. Social History  Substance Use Topics  . Smoking status: Never Smoker   . Smokeless tobacco: Never Used  . Alcohol Use: No   OB History    No data available     Review of Systems A 10 point review of systems was completed and was negative except for pertinent positives and negatives as mentioned in the history of present illness     Allergies  Codeine  Home Medications   Prior to Admission medications    Medication Sig Start Date End Date Taking? Authorizing Provider  albuterol (PROVENTIL HFA;VENTOLIN HFA) 108 (90 BASE) MCG/ACT inhaler Inhale 1 puff into the lungs every 6 (six) hours as needed for wheezing or shortness of breath.    Historical Provider, MD  albuterol (PROVENTIL) (2.5 MG/3ML) 0.083% nebulizer solution Take 3 mLs (2.5 mg total) by nebulization every 6 (six) hours as needed for wheezing or shortness of breath. 05/19/14   Alfonzo Beers, MD  aspirin EC 325 MG tablet Take 325 mg by mouth daily.    Historical Provider, MD  furosemide (LASIX) 40 MG tablet Take 40 mg by mouth daily as needed for fluid.     Historical Provider, MD  hydrochlorothiazide (HYDRODIURIL) 25 MG tablet Take 1 tablet (25 mg total) by mouth daily. 05/22/15   Virgel Manifold, MD  hydrochlorothiazide (HYDRODIURIL) 25 MG tablet Take 1 tablet (25 mg total) by mouth daily. 08/22/15   Comer Locket, PA-C  metoprolol (LOPRESSOR) 100 MG tablet Take 100 mg by mouth 2 (two) times daily.    Historical Provider, MD  metoprolol (LOPRESSOR) 100 MG tablet Take 1 tablet (100 mg total) by mouth 2 (two) times daily. 05/22/15 05/21/16  Virgel Manifold, MD  metoprolol tartrate (LOPRESSOR) 100 MG tablet Take 1 tablet (100 mg total) by mouth once. 08/22/15   Comer Locket, PA-C  potassium chloride 20 MEQ TBCR One a day 04/03/15   Fransico Meadow, PA-C  predniSONE (DELTASONE) 50 MG tablet Take 1  tablet (50 mg total) by mouth daily. Patient not taking: Reported on 04/03/2015 05/19/14   Alfonzo Beers, MD  sertraline (ZOLOFT) 50 MG tablet Take 50 mg by mouth daily.    Historical Provider, MD  traZODone (DESYREL) 100 MG tablet Take 1 tablet (100 mg total) by mouth at bedtime. Patient not taking: Reported on 04/03/2015 07/27/12   Roselee Culver, MD   BP 179/98 mmHg  Pulse 66  Temp(Src) 98.1 F (36.7 C) (Oral)  Resp 17  Ht 5\' 4"  (1.626 m)  Wt 194 lb 4 oz (88.111 kg)  BMI 33.33 kg/m2  SpO2 100% Physical Exam  Constitutional: She is oriented to  person, place, and time. She appears well-developed and well-nourished.  HENT:  Head: Normocephalic and atraumatic.  Mouth/Throat: Oropharynx is clear and moist.  Eyes: Conjunctivae are normal. Pupils are equal, round, and reactive to light. Right eye exhibits no discharge. Left eye exhibits no discharge. No scleral icterus.  Extraocular movements intact without nystagmus.  Neck: Neck supple.  Cardiovascular: Normal rate, regular rhythm and normal heart sounds.   Pulmonary/Chest: Effort normal and breath sounds normal. No respiratory distress. She has no wheezes. She has no rales.  Abdominal: Soft. There is no tenderness.  Musculoskeletal: She exhibits no tenderness.  Neurological: She is alert and oriented to person, place, and time.  Cranial Nerves II-XII grossly intact. Motor strength is 5/5 in all 4 extremities. Sensation intact to light touch. Completes finger to nose and heel to shin coordination movements without difficulty.  Skin: Skin is warm and dry. No rash noted.  Psychiatric: She has a normal mood and affect.  Nursing note and vitals reviewed.   ED Course  Procedures (including critical care time) Labs Review Labs Reviewed  COMPREHENSIVE METABOLIC PANEL - Abnormal; Notable for the following:    Potassium 3.4 (*)    Chloride 100 (*)    ALT 7 (*)    All other components within normal limits  CBC WITH DIFFERENTIAL/PLATELET - Abnormal; Notable for the following:    Hemoglobin 11.5 (*)    HCT 35.1 (*)    MCV 77.0 (*)    MCH 25.2 (*)    All other components within normal limits  URINALYSIS, ROUTINE W REFLEX MICROSCOPIC (NOT AT [facility])    Imaging Review Ct Head Wo Contrast  08/22/2015   CLINICAL DATA:  64 year old female with history of dizziness for 1 day.  EXAM: CT HEAD WITHOUT CONTRAST  TECHNIQUE: Contiguous axial images were obtained from the base of the skull through the vertex without intravenous contrast.  COMPARISON:  Head CT 01/29/2007.  FINDINGS: No acute  intracranial abnormalities. Specifically, no evidence of acute intracranial hemorrhage, no definite findings of acute/subacute cerebral ischemia, no mass, mass effect, hydrocephalus or abnormal intra or extra-axial fluid collections. Visualized paranasal sinuses and mastoids are well pneumatized. No acute displaced skull fractures are identified.  IMPRESSION: *No acute intracranial abnormalities. *The appearance of the brain is normal.   Electronically Signed   By: Vinnie Langton M.D.   On: 08/22/2015 13:34   I have personally reviewed and evaluated these images and lab results as part of my medical decision-making.   EKG Interpretation None       Meds given in ED:  Medications  hydrochlorothiazide (HYDRODIURIL) tablet 25 mg (25 mg Oral Given 08/22/15 1343)  ondansetron (ZOFRAN-ODT) disintegrating tablet 4 mg (4 mg Oral Given 08/22/15 1211)  sodium chloride 0.9 % bolus 1,000 mL (0 mLs Intravenous Stopped 08/22/15 1601)  ondansetron (ZOFRAN) injection 4  mg (4 mg Intravenous Given 08/22/15 1334)  potassium chloride SA (K-DUR,KLOR-CON) CR tablet 40 mEq (40 mEq Oral Given 08/22/15 1339)  metoprolol tartrate (LOPRESSOR) tablet 100 mg (100 mg Oral Given 08/22/15 1335)    Discharge Medication List as of 08/22/2015  3:52 PM    START taking these medications   Details  !! hydrochlorothiazide (HYDRODIURIL) 25 MG tablet Take 1 tablet (25 mg total) by mouth daily., Starting 08/22/2015, Until Discontinued, Print    !! metoprolol tartrate (LOPRESSOR) 100 MG tablet Take 1 tablet (100 mg total) by mouth once., Starting 08/22/2015, Print     !! - Potential duplicate medications found. Please discuss with provider.     Filed Vitals:   08/22/15 1345 08/22/15 1400 08/22/15 1445 08/22/15 1601  BP: 178/92 182/83 162/88 179/98  Pulse: 62 64 63 66  Temp:      TempSrc:      Resp: 19 16 20 17   Height:      Weight:      SpO2:  100% 99% 100%    MDM  Vitals stable  -afebrile Blood pressure treated in the ED  with patient's home meds of HCTZ and metoprolol. Pt resting comfortably in ED. PE--physical exam as above. Normal neuro exam. Some evidence of dehydration on exam, treated with 1 L normal saline. Labwork-potassium 3.4, given oral potassium in the ED. urinalysis without evidence of infection. Labs are otherwise unremarkable. Imaging-CT of head is negative for any acute intracranial abnormality.  DDX--patient's symptoms likely secondary to dehydration and some degree of orthostasis. Patient has normal neurological exam with no focal neurodeficits in the ED. Normal CT head. Low suspicion for any acute or emergent central lesion/stroke. Ambulates throughout the ED without difficulty. Gait without ataxia. Will refill patient's blood pressure medications and stressed importance of follow-up with PCP for medication reconciliation and reevaluation.  I discussed all relevant lab findings and imaging results with pt and they verbalized understanding. Discussed f/u with PCP within 48 hrs and return precautions, pt very amenable to plan. Prior to patient discharge, I discussed and reviewed this case with Dr. Billy Fischer, who also saw and evaluated the patient.  Final diagnoses:  Essential hypertension  Dizziness       Comer Locket, PA-C 08/22/15 1643  Gareth Morgan, MD 08/24/15 1035  Gareth Morgan, MD 08/24/15 1047

## 2016-03-19 ENCOUNTER — Encounter (HOSPITAL_COMMUNITY): Payer: Self-pay | Admitting: *Deleted

## 2016-03-19 ENCOUNTER — Ambulatory Visit (HOSPITAL_COMMUNITY)
Admission: EM | Admit: 2016-03-19 | Discharge: 2016-03-19 | Disposition: A | Discharge status: Home / Self Care | Payer: Medicare Other | Attending: Family Medicine | Admitting: Family Medicine

## 2016-03-19 DIAGNOSIS — M10071 Idiopathic gout, right ankle and foot: Secondary | ICD-10-CM

## 2016-03-19 HISTORY — DX: Unspecified asthma, uncomplicated: J45.909

## 2016-03-19 HISTORY — DX: Gout, unspecified: M10.9

## 2016-03-19 MED ORDER — COLCHICINE 0.6 MG PO TABS
0.6000 mg | ORAL_TABLET | Freq: Two times a day (BID) | ORAL | Status: DC
Start: 2016-03-19 — End: 2016-06-06

## 2016-03-19 MED ORDER — DICLOFENAC SODIUM 1 % TD GEL: 2.0000 g | Freq: Four times a day (QID) | TRANSDERMAL | Status: DC

## 2016-03-19 NOTE — ED Notes (Signed)
C/O gout flare-up in right great toe and foot x 4 days.  Does not have any gout med.

## 2016-03-19 NOTE — ED Provider Notes (Signed)
CSN: [id number]     Arrival date & time 03/19/16  1739 History   First MD Initiated Contact with Patient 03/19/16 1759     Chief Complaint  Patient presents with  . Gout   (Consider location/radiation/quality/duration/timing/severity/associated sxs/prior Treatment) Patient is a 65 y.o. female presenting with lower extremity pain. The history is provided by the patient.  Foot Pain This is a new problem. The current episode started more than 2 days ago. The problem has been gradually worsening. Associated symptoms comments: Pain and sts to gt toe mtp joint.. The symptoms are aggravated by walking.    Past Medical History  Diagnosis Date  . Hypertension   . Pneumonia   . Asthma   . Gout    Past Surgical History  Procedure Laterality Date  . Cesarean section     No family history on file. Social History  Substance Use Topics  . Smoking status: Never Smoker   . Smokeless tobacco: Never Used  . Alcohol Use: No   OB History    No data available     Review of Systems  Constitutional: Negative.   Musculoskeletal: Positive for joint swelling and gait problem.  Skin: Negative.   All other systems reviewed and are negative.   Allergies  Codeine  Home Medications   Prior to Admission medications   Medication Sig Start Date End Date Taking? Authorizing Provider  albuterol (PROVENTIL HFA;VENTOLIN HFA) 108 (90 BASE) MCG/ACT inhaler Inhale 1 puff into the lungs every 6 (six) hours as needed for wheezing or shortness of breath.   Yes Historical Provider, MD  albuterol (PROVENTIL) (2.5 MG/3ML) 0.083% nebulizer solution Take 3 mLs (2.5 mg total) by nebulization every 6 (six) hours as needed for wheezing or shortness of breath. 05/19/14  Yes Alfonzo Beers, MD  aspirin EC 325 MG tablet Take 325 mg by mouth daily.   Yes Historical Provider, MD  furosemide (LASIX) 40 MG tablet Take 40 mg by mouth daily as needed for fluid.    Yes Historical Provider, MD  hydrochlorothiazide (HYDRODIURIL)  25 MG tablet Take 1 tablet (25 mg total) by mouth daily. 08/22/15  Yes Benjamin Cartner, PA-C  metoprolol (LOPRESSOR) 100 MG tablet Take 100 mg by mouth 2 (two) times daily.   Yes Historical Provider, MD  traZODone (DESYREL) 100 MG tablet Take 1 tablet (100 mg total) by mouth at bedtime. 07/27/12  Yes Roselee Culver, MD  UNKNOWN TO PATIENT Another HTN med   Yes Historical Provider, MD  hydrochlorothiazide (HYDRODIURIL) 25 MG tablet Take 1 tablet (25 mg total) by mouth daily. 05/22/15   Virgel Manifold, MD  metoprolol (LOPRESSOR) 100 MG tablet Take 1 tablet (100 mg total) by mouth 2 (two) times daily. 05/22/15 05/21/16  Virgel Manifold, MD  metoprolol tartrate (LOPRESSOR) 100 MG tablet Take 1 tablet (100 mg total) by mouth once. 08/22/15   Comer Locket, PA-C  potassium chloride 20 MEQ TBCR One a day 04/03/15   Fransico Meadow, PA-C  predniSONE (DELTASONE) 50 MG tablet Take 1 tablet (50 mg total) by mouth daily. Patient not taking: Reported on 04/03/2015 05/19/14   Alfonzo Beers, MD  sertraline (ZOLOFT) 50 MG tablet Take 50 mg by mouth daily.    Historical Provider, MD   Meds Ordered and Administered this Visit  Medications - No data to display  BP 145/87 mmHg  Pulse 74  Temp(Src) 99 F (37.2 C) (Oral)  Resp 18  SpO2 95% No data found.   Physical Exam  Constitutional: She  is oriented to person, place, and time. She appears well-developed and well-nourished. No distress.  Musculoskeletal: She exhibits tenderness.       Feet:  Neurological: She is alert and oriented to person, place, and time.  Skin: Skin is warm and dry.  Nursing note and vitals reviewed.   ED Course  Procedures (including critical care time)  Labs Review Labs Reviewed - No data to display  Imaging Review No results found.   Visual Acuity Review  Right Eye Distance:   Left Eye Distance:   Bilateral Distance:    Right Eye Near:   Left Eye Near:    Bilateral Near:         MDM  No diagnosis  found.   Meds ordered this encounter  Medications  . UNKNOWN TO PATIENT    Sig: Another HTN med  . diclofenac sodium (VOLTAREN) 1 % GEL    Sig: Apply 2 g topically 4 (four) times daily.    Dispense:  1 Tube    Refill:  1  . colchicine 0.6 MG tablet    Sig: Take 1 tablet (0.6 mg total) by mouth 2 (two) times daily. For gout    Dispense:  30 tablet    Refill:  0     Billy Fischer, MD 03/19/16 1840

## 2016-06-06 ENCOUNTER — Encounter (HOSPITAL_COMMUNITY): Payer: Self-pay

## 2016-06-06 ENCOUNTER — Emergency Department (HOSPITAL_COMMUNITY)
Admission: EM | Admit: 2016-06-06 | Discharge: 2016-06-06 | Disposition: A | Discharge status: Home / Self Care | Payer: Medicare Other | Attending: Emergency Medicine | Admitting: Emergency Medicine

## 2016-06-06 DIAGNOSIS — Z7982 Long term (current) use of aspirin: Secondary | ICD-10-CM | POA: Diagnosis not present

## 2016-06-06 DIAGNOSIS — M109 Gout, unspecified: Secondary | ICD-10-CM

## 2016-06-06 DIAGNOSIS — J45909 Unspecified asthma, uncomplicated: Secondary | ICD-10-CM | POA: Insufficient documentation

## 2016-06-06 DIAGNOSIS — M25572 Pain in left ankle and joints of left foot: Secondary | ICD-10-CM | POA: Diagnosis present

## 2016-06-06 DIAGNOSIS — M10072 Idiopathic gout, left ankle and foot: Secondary | ICD-10-CM | POA: Insufficient documentation

## 2016-06-06 DIAGNOSIS — I10 Essential (primary) hypertension: Secondary | ICD-10-CM | POA: Insufficient documentation

## 2016-06-06 MED ORDER — COLCHICINE 0.6 MG PO TABS
0.6000 mg | ORAL_TABLET | Freq: Two times a day (BID) | ORAL | Status: DC
Start: 2016-06-06 — End: 2016-06-06

## 2016-06-06 MED ORDER — COLCHICINE 0.6 MG PO TABS
0.6000 mg | ORAL_TABLET | Freq: Once | ORAL | Status: AC
  Administered 2016-06-06: 0.6 mg via ORAL
  Filled 2016-06-06: qty 1

## 2016-06-06 MED ORDER — PREDNISONE 20 MG PO TABS: 40.0000 mg | ORAL_TABLET | Freq: Every day | ORAL | Status: DC

## 2016-06-06 NOTE — ED Provider Notes (Signed)
CSN: [id number]     Arrival date & time 06/06/16  [id number] History  By signing my name below, I, Christine Porter, attest that this documentation has been prepared under the direction and in the presence of Gloriann Loan, PA-C. Electronically Signed: Eustaquio Porter, ED Scribe. 06/06/2016. 9:37 AM.    Chief Complaint  Patient presents with  . Foot Pain   The history is provided by the patient. No language interpreter was used.     HPI Comments: Christine Porter is a 65 y.o. female with PMHx HTN, Asthma, and Gout who presents to the Emergency Department complaining of gradual onset, constant, left ankle pain x 2 days. No known injury to the area. She has applied ice, elevated the area, and took Ibuprofen without relief. She is able to ambulate but notes pain with walking. Pt has hx of gout and states these symptoms feel similar to previous flares. She mentions that her gouty flares typically vary in location. She was seen in April for gout flare up and received colchicine and voltaren gel. She reports these worked well. Pt reports that she has had gout in her ankles before, but it has never been this severe. No recent EtOH intake. Pt recently stopped eating meat. Denies weakness, numbness, tingling, or any other associated symptoms.   Past Medical History  Diagnosis Date  . Hypertension   . Pneumonia   . Asthma   . Gout    Past Surgical History  Procedure Laterality Date  . Cesarean section     No family history on file. Social History  Substance Use Topics  . Smoking status: Never Smoker   . Smokeless tobacco: Never Used  . Alcohol Use: No   OB History    No data available     Review of Systems  Musculoskeletal: Positive for arthralgias.  Neurological: Negative for weakness and numbness.   Allergies  Codeine  Home Medications   Prior to Admission medications   Medication Sig Start Date End Date Taking? Authorizing Provider  albuterol (PROVENTIL HFA;VENTOLIN HFA) 108 (90 BASE)  MCG/ACT inhaler Inhale 1 puff into the lungs every 6 (six) hours as needed for wheezing or shortness of breath.    Historical Provider, MD  albuterol (PROVENTIL) (2.5 MG/3ML) 0.083% nebulizer solution Take 3 mLs (2.5 mg total) by nebulization every 6 (six) hours as needed for wheezing or shortness of breath. 05/19/14   Alfonzo Beers, MD  aspirin EC 325 MG tablet Take 325 mg by mouth daily.    Historical Provider, MD  colchicine 0.6 MG tablet Take 1 tablet (0.6 mg total) by mouth 2 (two) times daily. 06/06/16   Gloriann Loan, PA-C  diclofenac sodium (VOLTAREN) 1 % GEL Apply 2 g topically 4 (four) times daily. 03/19/16   Billy Fischer, MD  furosemide (LASIX) 40 MG tablet Take 40 mg by mouth daily as needed for fluid.     Historical Provider, MD  hydrochlorothiazide (HYDRODIURIL) 25 MG tablet Take 1 tablet (25 mg total) by mouth daily. 05/22/15   Virgel Manifold, MD  hydrochlorothiazide (HYDRODIURIL) 25 MG tablet Take 1 tablet (25 mg total) by mouth daily. 08/22/15   Comer Locket, PA-C  metoprolol (LOPRESSOR) 100 MG tablet Take 100 mg by mouth 2 (two) times daily.    Historical Provider, MD  metoprolol (LOPRESSOR) 100 MG tablet Take 1 tablet (100 mg total) by mouth 2 (two) times daily. 05/22/15 05/21/16  Virgel Manifold, MD  metoprolol tartrate (LOPRESSOR) 100 MG tablet Take 1 tablet (100 mg  total) by mouth once. 08/22/15   Comer Locket, PA-C  potassium chloride 20 MEQ TBCR One a day 04/03/15   Fransico Meadow, PA-C  predniSONE (DELTASONE) 50 MG tablet Take 1 tablet (50 mg total) by mouth daily. Patient not taking: Reported on 04/03/2015 05/19/14   Alfonzo Beers, MD  sertraline (ZOLOFT) 50 MG tablet Take 50 mg by mouth daily.    Historical Provider, MD  traZODone (DESYREL) 100 MG tablet Take 1 tablet (100 mg total) by mouth at bedtime. 07/27/12   Roselee Culver, MD  UNKNOWN TO PATIENT Another HTN med    Historical Provider, MD   BP 153/92 mmHg  Pulse 79  Temp(Src) 98.9 F (37.2 C) (Oral)  Resp 18  SpO2 96%    Physical Exam  Constitutional: She is oriented to person, place, and time. She appears well-developed and well-nourished.  HENT:  Head: Normocephalic and atraumatic.  Right Ear: External ear normal.  Left Ear: External ear normal.  Eyes: Conjunctivae are normal. No scleral icterus.  Neck: No tracheal deviation present.  Cardiovascular:  Pulses:      Dorsalis pedis pulses are 2+ on the right side, and 2+ on the left side.  No unilateral LE edema.   Pulmonary/Chest: Effort normal. No respiratory distress.  Abdominal: She exhibits no distension.  Musculoskeletal: She exhibits edema.  Left ankle; mild swelling of the lateral ankle. No overlying warmth, erythema, or signs of infection. Slightly decreased ROM due to pain.   Neurological: She is alert and oriented to person, place, and time.  Sensation and strength intact.   Skin: Skin is warm and dry.  Psychiatric: She has a normal mood and affect. Her behavior is normal.    ED Course  Procedures (including critical care time)  DIAGNOSTIC STUDIES: Oxygen Saturation is 96% on RA, normal by my interpretation.    COORDINATION OF CARE: 9:33 AM-Discussed treatment plan which includes Rx Colchicine and crutches with pt at bedside and pt agreed to plan.   Labs Review Labs Reviewed - No data to display  Imaging Review No results found. I have personally reviewed and evaluated these images and lab results as part of my medical decision-making.   EKG Interpretation None      MDM   Final diagnoses:  Acute gout of left ankle, unspecified cause   Findings consistent with acute gout flare.  VSS, NAD.  Neurovascularly intact, no injury.  No indication for imaging at this time.  Patient given 1.2 mg colchicine in ED.  Home with crutches and colchicine.  Advised patient to take 0.6 mg in 1 hour and then in 12 hours begin taking 0.6 mg colchicine BID for prophylaxis.  Discussed PCP follow up for possible gout maintenance medications to  prevent future flares.  Crutches given for assistance.  Return precautions discussed.  Patient agrees and acknowledges the above plan for discharge.   At time of discharge, patient requests prednisone.  She states that she cannot afford the colchicine at this time.  D/c colchicine and will d/c home with prednisone.  I personally performed the services described in this documentation, which was scribed in my presence. The recorded information has been reviewed and is accurate.       Gloriann Loan, PA-C 06/06/16 [place], MD 06/06/16 1013

## 2016-06-06 NOTE — ED Notes (Signed)
Patient here with 2 days of left foot pain, worse with ambulation and states that it radiates slightly to ankle, denies injury, states I know its my gout hx of same

## 2016-06-06 NOTE — Discharge Instructions (Signed)
Take 1 tablet, 0.6 mg colchicine 1 hour from when you were discharged.  You may begin taking colchicine two times daily to prevent further attacks for the next week.  Please follow up with your primary care physician for possible maintenance gout medications which will prevent further flares.   Gout Gout is an inflammatory arthritis caused by a buildup of uric acid crystals in the joints. Uric acid is a chemical that is normally present in the blood. When the level of uric acid in the blood is too high it can form crystals that deposit in your joints and tissues. This causes joint redness, soreness, and swelling (inflammation). Repeat attacks are common. Over time, uric acid crystals can form into masses (tophi) near a joint, destroying bone and causing disfigurement. Gout is treatable and often preventable. CAUSES  The disease begins with elevated levels of uric acid in the blood. Uric acid is produced by your body when it breaks down a naturally found substance called purines. Certain foods you eat, such as meats and fish, contain high amounts of purines. Causes of an elevated uric acid level include:  Being passed down from parent to child (heredity).  Diseases that cause increased uric acid production (such as obesity, psoriasis, and certain cancers).  Excessive alcohol use.  Diet, especially diets rich in meat and seafood.  Medicines, including certain cancer-fighting medicines (chemotherapy), water pills (diuretics), and aspirin.  Chronic kidney disease. The kidneys are no longer able to remove uric acid well.  Problems with metabolism. Conditions strongly associated with gout include:  Obesity.  High blood pressure.  High cholesterol.  Diabetes. Not everyone with elevated uric acid levels gets gout. It is not understood why some people get gout and others do not. Surgery, joint injury, and eating too much of certain foods are some of the factors that can lead to gout  attacks. SYMPTOMS   An attack of gout comes on quickly. It causes intense pain with redness, swelling, and warmth in a joint.  Fever can occur.  Often, only one joint is involved. Certain joints are more commonly involved:  Base of the big toe.  Knee.  Ankle.  Wrist.  Finger. Without treatment, an attack usually goes away in a few days to weeks. Between attacks, you usually will not have symptoms, which is different from many other forms of arthritis. DIAGNOSIS  Your caregiver will suspect gout based on your symptoms and exam. In some cases, tests may be recommended. The tests may include:  Blood tests.  Urine tests.  X-rays.  Joint fluid exam. This exam requires a needle to remove fluid from the joint (arthrocentesis). Using a microscope, gout is confirmed when uric acid crystals are seen in the joint fluid. TREATMENT  There are two phases to gout treatment: treating the sudden onset (acute) attack and preventing attacks (prophylaxis).  Treatment of an Acute Attack.  Medicines are used. These include anti-inflammatory medicines or steroid medicines.  An injection of steroid medicine into the affected joint is sometimes necessary.  The painful joint is rested. Movement can worsen the arthritis.  You may use warm or cold treatments on painful joints, depending which works best for you.  Treatment to Prevent Attacks.  If you suffer from frequent gout attacks, your caregiver may advise preventive medicine. These medicines are started after the acute attack subsides. These medicines either help your kidneys eliminate uric acid from your body or decrease your uric acid production. You may need to stay on these medicines for  a very long time.  The early phase of treatment with preventive medicine can be associated with an increase in acute gout attacks. For this reason, during the first few months of treatment, your caregiver may also advise you to take medicines usually used  for acute gout treatment. Be sure you understand your caregiver's directions. Your caregiver may make several adjustments to your medicine dose before these medicines are effective.  Discuss dietary treatment with your caregiver or dietitian. Alcohol and drinks high in sugar and fructose and foods such as meat, poultry, and seafood can increase uric acid levels. Your caregiver or dietitian can advise you on drinks and foods that should be limited. HOME CARE INSTRUCTIONS   Do not take aspirin to relieve pain. This raises uric acid levels.  Only take over-the-counter or prescription medicines for pain, discomfort, or fever as directed by your caregiver.  Rest the joint as much as possible. When in bed, keep sheets and blankets off painful areas.  Keep the affected joint raised (elevated).  Apply warm or cold treatments to painful joints. Use of warm or cold treatments depends on which works best for you.  Use crutches if the painful joint is in your leg.  Drink enough fluids to keep your urine clear or pale yellow. This helps your body get rid of uric acid. Limit alcohol, sugary drinks, and fructose drinks.  Follow your dietary instructions. Pay careful attention to the amount of protein you eat. Your daily diet should emphasize fruits, vegetables, whole grains, and fat-free or low-fat milk products. Discuss the use of coffee, vitamin C, and cherries with your caregiver or dietitian. These may be helpful in lowering uric acid levels.  Maintain a healthy body weight. SEEK MEDICAL CARE IF:   You develop diarrhea, vomiting, or any side effects from medicines.  You do not feel better in 24 hours, or you are getting worse. SEEK IMMEDIATE MEDICAL CARE IF:   Your joint becomes suddenly more tender, and you have chills or a fever. MAKE SURE YOU:   Understand these instructions.  Will watch your condition.  Will get help right away if you are not doing well or get worse.   This information  is not intended to replace advice given to you by your health care provider. Make sure you discuss any questions you have with your health care provider.   Document Released: 11/10/2000 Document Revised: 12/04/2014 Document Reviewed: 06/26/2012 Elsevier Interactive Patient Education [employer].

## 2016-06-06 NOTE — ED Notes (Addendum)
"  I've got gout in my foot". C/o pain left ankle pain. Hx of gout per pt's report. States is out of gout meds and "can't afford to buy any". Slight redness and swelling to left lateral ankle. Pt requesting something for pain.

## 2016-12-08 DIAGNOSIS — R69 Illness, unspecified: Secondary | ICD-10-CM | POA: Diagnosis not present

## 2016-12-22 DIAGNOSIS — R69 Illness, unspecified: Secondary | ICD-10-CM | POA: Diagnosis not present

## 2016-12-25 DIAGNOSIS — E669 Obesity, unspecified: Secondary | ICD-10-CM | POA: Diagnosis not present

## 2016-12-25 DIAGNOSIS — R7303 Prediabetes: Secondary | ICD-10-CM | POA: Diagnosis not present

## 2016-12-25 DIAGNOSIS — D649 Anemia, unspecified: Secondary | ICD-10-CM | POA: Diagnosis not present

## 2016-12-25 DIAGNOSIS — E559 Vitamin D deficiency, unspecified: Secondary | ICD-10-CM | POA: Diagnosis not present

## 2016-12-29 DIAGNOSIS — R69 Illness, unspecified: Secondary | ICD-10-CM | POA: Diagnosis not present

## 2017-01-12 DIAGNOSIS — R69 Illness, unspecified: Secondary | ICD-10-CM | POA: Diagnosis not present

## 2017-01-19 DIAGNOSIS — R69 Illness, unspecified: Secondary | ICD-10-CM | POA: Diagnosis not present

## 2017-01-26 DIAGNOSIS — R69 Illness, unspecified: Secondary | ICD-10-CM | POA: Diagnosis not present

## 2017-02-02 DIAGNOSIS — R69 Illness, unspecified: Secondary | ICD-10-CM | POA: Diagnosis not present

## 2017-02-23 DIAGNOSIS — R69 Illness, unspecified: Secondary | ICD-10-CM | POA: Diagnosis not present

## 2017-03-05 DIAGNOSIS — R69 Illness, unspecified: Secondary | ICD-10-CM | POA: Diagnosis not present

## 2017-03-21 DIAGNOSIS — R69 Illness, unspecified: Secondary | ICD-10-CM | POA: Diagnosis not present

## 2017-03-22 DIAGNOSIS — R69 Illness, unspecified: Secondary | ICD-10-CM | POA: Diagnosis not present

## 2017-04-06 DIAGNOSIS — R69 Illness, unspecified: Secondary | ICD-10-CM | POA: Diagnosis not present

## 2017-04-13 DIAGNOSIS — R69 Illness, unspecified: Secondary | ICD-10-CM | POA: Diagnosis not present

## 2017-04-20 DIAGNOSIS — R69 Illness, unspecified: Secondary | ICD-10-CM | POA: Diagnosis not present

## 2017-04-23 ENCOUNTER — Emergency Department (HOSPITAL_COMMUNITY)
Admission: EM | Admit: 2017-04-23 | Discharge: 2017-04-23 | Disposition: A | Discharge status: Home / Self Care | Payer: Medicare HMO | Attending: Emergency Medicine | Admitting: Emergency Medicine

## 2017-04-23 ENCOUNTER — Emergency Department (HOSPITAL_COMMUNITY): Payer: Medicare HMO

## 2017-04-23 ENCOUNTER — Encounter (HOSPITAL_COMMUNITY): Payer: Self-pay | Admitting: *Deleted

## 2017-04-23 DIAGNOSIS — R202 Paresthesia of skin: Secondary | ICD-10-CM | POA: Diagnosis present

## 2017-04-23 DIAGNOSIS — M109 Gout, unspecified: Secondary | ICD-10-CM | POA: Diagnosis not present

## 2017-04-23 DIAGNOSIS — J45909 Unspecified asthma, uncomplicated: Secondary | ICD-10-CM | POA: Diagnosis not present

## 2017-04-23 DIAGNOSIS — I10 Essential (primary) hypertension: Secondary | ICD-10-CM | POA: Insufficient documentation

## 2017-04-23 DIAGNOSIS — M79672 Pain in left foot: Secondary | ICD-10-CM | POA: Diagnosis not present

## 2017-04-23 DIAGNOSIS — Z79899 Other long term (current) drug therapy: Secondary | ICD-10-CM | POA: Insufficient documentation

## 2017-04-23 DIAGNOSIS — Z7982 Long term (current) use of aspirin: Secondary | ICD-10-CM | POA: Insufficient documentation

## 2017-04-23 DIAGNOSIS — R52 Pain, unspecified: Secondary | ICD-10-CM

## 2017-04-23 MED ORDER — PREDNISONE 20 MG PO TABS
50.0000 mg | ORAL_TABLET | Freq: Once | ORAL | Status: AC
  Administered 2017-04-23: 50 mg via ORAL
  Filled 2017-04-23: qty 3

## 2017-04-23 MED ORDER — OXYCODONE-ACETAMINOPHEN 5-325 MG PO TABS: 1.0000 | ORAL_TABLET | ORAL | 0 refills | Status: DC | PRN

## 2017-04-23 MED ORDER — PREDNISONE 10 MG PO TABS: 50.0000 mg | ORAL_TABLET | Freq: Every day | ORAL | 0 refills | Status: DC

## 2017-04-23 MED ORDER — COLCHICINE 0.6 MG PO TABS: 0.6000 mg | ORAL_TABLET | Freq: Once | ORAL | Status: DC

## 2017-04-23 MED ORDER — OXYCODONE-ACETAMINOPHEN 5-325 MG PO TABS
1.0000 | ORAL_TABLET | Freq: Once | ORAL | Status: AC
  Administered 2017-04-23: 1 via ORAL
  Filled 2017-04-23: qty 1

## 2017-04-23 NOTE — ED Notes (Signed)
Pt called out asking to speak to PA.  Asked if I could help, patient requested only to speak to PA.  PA at bedside at this time.

## 2017-04-23 NOTE — ED Triage Notes (Signed)
Pt states gout flare-up to toes on L foot. Has not taken gout meds for some time.  Also c/o tingling and tightness to 1st 3 digits of both hands x 6 months, R greater than L.

## 2017-04-23 NOTE — ED Notes (Signed)
PA at bedside.

## 2017-04-23 NOTE — ED Provider Notes (Signed)
[facility] DEPT Provider Note   CSN: [id number] Arrival date & time: 04/23/17  1557  By signing my name below, I, Margit Banda, attest that this documentation has been prepared under the direction and in the presence of Avery Eustice, [place]. Electronically Signed: Margit Banda, ED Scribe. 04/23/17. 5:40 PM.  History   Chief Complaint Chief Complaint  Patient presents with  . Foot Pain  . Tingling    HPI Christine Porter is a 66 y.o. female with a PMHx of gout who presents to the Emergency Department complaining of gradually worsening, constant, gout flare-up to her left foot. Pt rates pain a 8/10 severity. She has tried tylenol and Asprin with mild relief. Movement exacerbates her pain. No hx of renal issues. Additionally she c/o of intermittent numbness and tingling to her hands that started ~ 6 months ago. She has been seen for this issue but, it has not improved. Was prescribed medication with mild to no relief. Pt denies fever, chills, nausea, vomiting, and diarrhea.  The history is provided by the patient. No language interpreter was used.    Past Medical History:  Diagnosis Date  . Asthma   . Gout   . Hypertension   . Pneumonia     There are no active problems to display for this patient.   Past Surgical History:  Procedure Laterality Date  . CESAREAN SECTION      OB History    No data available       Home Medications    Prior to Admission medications   Medication Sig Start Date End Date Taking? Authorizing Provider  albuterol (PROVENTIL HFA;VENTOLIN HFA) 108 (90 BASE) MCG/ACT inhaler Inhale 1 puff into the lungs every 6 (six) hours as needed for wheezing or shortness of breath.    [provider]  albuterol (PROVENTIL) (2.5 MG/3ML) 0.083% nebulizer solution Take 3 mLs (2.5 mg total) by nebulization every 6 (six) hours as needed for wheezing or shortness of breath. 05/19/14   Alfonzo Beers, MD  aspirin EC 325 MG tablet Take 325 mg by mouth  daily.    [provider]  diclofenac sodium (VOLTAREN) 1 % GEL Apply 2 g topically 4 (four) times daily. 03/19/16   Billy Fischer, MD  furosemide (LASIX) 40 MG tablet Take 40 mg by mouth daily as needed for fluid.     [provider]  hydrochlorothiazide (HYDRODIURIL) 25 MG tablet Take 1 tablet (25 mg total) by mouth daily. 05/22/15   Virgel Manifold, MD  hydrochlorothiazide (HYDRODIURIL) 25 MG tablet Take 1 tablet (25 mg total) by mouth daily. 08/22/15   Cartner, Marland Kitchen, PA-C  metoprolol (LOPRESSOR) 100 MG tablet Take 100 mg by mouth 2 (two) times daily.    [provider]  metoprolol (LOPRESSOR) 100 MG tablet Take 1 tablet (100 mg total) by mouth 2 (two) times daily. 05/22/15 05/21/16  Virgel Manifold, MD  metoprolol tartrate (LOPRESSOR) 100 MG tablet Take 1 tablet (100 mg total) by mouth once. 08/22/15   Cartner, Marland Kitchen, PA-C  oxyCODONE-acetaminophen (PERCOCET/ROXICET) 5-325 MG tablet Take 1-2 tablets by mouth every 4 (four) hours as needed for severe pain. 04/23/17   Bettey Costa, PA  potassium chloride 20 MEQ TBCR One a day 04/03/15   Fransico Meadow, PA-C  predniSONE (DELTASONE) 10 MG tablet Take 5 tablets (50 mg total) by mouth daily with breakfast. Take 50 mg tomorrow the 2nd day, 40 mg third and fourth day, 30 mg fifth and sixth day, 20 mg seventh and  eighth day, 10 mg ninth and 10th day. 04/23/17   Bettey Costa, PA  sertraline (ZOLOFT) 50 MG tablet Take 50 mg by mouth daily.    [provider]  traZODone (DESYREL) 100 MG tablet Take 1 tablet (100 mg total) by mouth at bedtime. 07/27/12   Roselee Culver, MD  UNKNOWN TO PATIENT Another HTN med    [provider]    Family History No family history on file.  Social History Social History  Substance Use Topics  . Smoking status: Never Smoker  . Smokeless tobacco: Never Used  . Alcohol use No     Allergies   Codeine   Review of Systems Review of Systems    Constitutional: Negative for chills and fever.  Gastrointestinal: Negative for diarrhea, nausea and vomiting.  Musculoskeletal:       Left foot pain.  Neurological: Positive for weakness (bilateral hands) and numbness (bilateral hands).     Physical Exam Updated Vital Signs BP 116/88 (BP Location: Right Arm)   Pulse 80   Temp 98.1 F (36.7 C) (Oral)   Resp 18   Ht 5\' 5"  (1.651 m)   Wt 99.8 kg (220 lb)   SpO2 99%   BMI 36.61 kg/m   Physical Exam  Constitutional: She appears well-developed and well-nourished.  Well appearing  HENT:  Head: Normocephalic and atraumatic.  Nose: Nose normal.  Eyes: Conjunctivae and EOM are normal.  Neck: Normal range of motion.  Cardiovascular: Normal rate and intact distal pulses.   2+ distal pulses bilateral LE.  Pulmonary/Chest: Effort normal. No respiratory distress.  Normal work of breathing. No respiratory distress noted.   Abdominal: Soft.  Musculoskeletal: Normal range of motion. She exhibits no edema or deformity.  No obvious swelling to the left foot. No obvious erythema. Moderate tenderness to palpation to second digit to the left foot.  No wound or deformity noted.  FROM.  Neurological: She is alert.  Sensation intact to bilateral LE. Muscle strength 5/5. Good against resistance to dorsal flexion and plantar flexion of right foot. Flexion and extension to all toes.   Skin: Skin is warm.  Psychiatric: She has a normal mood and affect. Her behavior is normal.  Nursing note and vitals reviewed.   ED Treatments / Results  DIAGNOSTIC STUDIES: Oxygen Saturation is 100% on RA, normal by my interpretation.   COORDINATION OF CARE: 5:40 PM-Discussed next steps with pt which includes following up with her PCP. Pt verbalized understanding and is agreeable with the plan.    Labs (all labs ordered are listed, but only abnormal results are displayed) Labs Reviewed - No data to display  EKG  EKG Interpretation None        Radiology Dg Foot Complete Left  Result Date: 04/23/2017 CLINICAL DATA:  Left foot pain. EXAM: LEFT FOOT - COMPLETE 3+ VIEW COMPARISON:  None. FINDINGS: There is no evidence of fracture or dislocation. There is no evidence of arthropathy. Spurring of posterior calcaneus is noted. Soft tissues are unremarkable. IMPRESSION: No acute abnormality seen in the left foot. Electronically Signed   By: Marijo Conception, M.D.   On: 04/23/2017 17:14    Procedures Procedures (including critical care time)  Medications Ordered in ED Medications  predniSONE (DELTASONE) tablet 50 mg (50 mg Oral Given 04/23/17 1855)  oxyCODONE-acetaminophen (PERCOCET/ROXICET) 5-325 MG per tablet 1 tablet (1 tablet Oral Given 04/23/17 1856)     Initial Impression / Assessment and Plan / ED Course  I have reviewed  the triage vital signs and the nursing notes.  Pertinent labs & imaging results that were available during my care of the patient were reviewed by me and considered in my medical decision making (see chart for details).    Pt presents with monoarticular pain, swelling and erythema.  Pt is afebrile and stable. Imaging reviewed, no evidence of occult fracture or injury. Pt without known peptic ulcer disease and not receiving concurrent treatment on warfarin. Pt dc with prednisone and percocet. Discussed that pt should follow up with her PCP. Patient given ASO bandage as requested. Patient verbally understands and is in agreement with assessment and plan. Reasons to return to ED discussed. Pt case discussed with Dr. Darl Householder who agreed with assessment and plan.   Final Clinical Impressions(s) / ED Diagnoses   Final diagnoses:  Acute gout involving toe of left foot, unspecified cause    New Prescriptions New Prescriptions   OXYCODONE-ACETAMINOPHEN (PERCOCET/ROXICET) 5-325 MG TABLET    Take 1-2 tablets by mouth every 4 (four) hours as needed for severe pain.   PREDNISONE (DELTASONE) 10 MG TABLET    Take 5 tablets  (50 mg total) by mouth daily with breakfast. Take 50 mg tomorrow the 2nd day, 40 mg third and fourth day, 30 mg fifth and sixth day, 20 mg seventh and eighth day, 10 mg ninth and 10th day.   I personally performed the services described in this documentation, which was scribed in my presence. The recorded information has been reviewed and is accurate.    Flonnie Overman [place], [place] 04/23/17 1909    Drenda Freeze, MD 04/23/17 2012

## 2017-04-23 NOTE — ED Notes (Signed)
Patient able to ambulate independently  

## 2017-04-23 NOTE — Discharge Instructions (Signed)
Please take Percocet as needed for severe pain. Please take prednisone every day for 10 days as prescribed. Please follow-up with your primary care provider regarding today's visit as discussed.  Contact a health care provider if: You have another gout attack. You continue to have symptoms of a gout attack after10 days of treatment. You have side effects from your medicines. You have chills or a fever. You have burning pain when you urinate. You have pain in your lower back or belly. Get help right away if: You have severe or uncontrolled pain. You cannot urinate.

## 2017-04-27 DIAGNOSIS — R69 Illness, unspecified: Secondary | ICD-10-CM | POA: Diagnosis not present

## 2017-05-01 DIAGNOSIS — Z0000 Encounter for general adult medical examination without abnormal findings: Secondary | ICD-10-CM | POA: Diagnosis not present

## 2017-05-01 DIAGNOSIS — Z6835 Body mass index (BMI) 35.0-35.9, adult: Secondary | ICD-10-CM | POA: Diagnosis not present

## 2017-05-01 DIAGNOSIS — E669 Obesity, unspecified: Secondary | ICD-10-CM | POA: Diagnosis not present

## 2017-05-01 DIAGNOSIS — I10 Essential (primary) hypertension: Secondary | ICD-10-CM | POA: Diagnosis not present

## 2017-05-01 DIAGNOSIS — M1A9XX0 Chronic gout, unspecified, without tophus (tophi): Secondary | ICD-10-CM | POA: Diagnosis not present

## 2017-05-01 DIAGNOSIS — M25572 Pain in left ankle and joints of left foot: Secondary | ICD-10-CM | POA: Diagnosis not present

## 2017-05-01 DIAGNOSIS — M25531 Pain in right wrist: Secondary | ICD-10-CM | POA: Diagnosis not present

## 2017-05-01 DIAGNOSIS — R69 Illness, unspecified: Secondary | ICD-10-CM | POA: Diagnosis not present

## 2017-05-01 DIAGNOSIS — Z79899 Other long term (current) drug therapy: Secondary | ICD-10-CM | POA: Diagnosis not present

## 2017-05-01 DIAGNOSIS — J45909 Unspecified asthma, uncomplicated: Secondary | ICD-10-CM | POA: Diagnosis not present

## 2017-05-02 ENCOUNTER — Emergency Department (HOSPITAL_COMMUNITY)
Admission: EM | Admit: 2017-05-02 | Discharge: 2017-05-02 | Disposition: A | Discharge status: Home / Self Care | Payer: Medicare HMO | Attending: Emergency Medicine | Admitting: Emergency Medicine

## 2017-05-02 ENCOUNTER — Encounter (HOSPITAL_COMMUNITY): Payer: Self-pay | Admitting: *Deleted

## 2017-05-02 DIAGNOSIS — K0889 Other specified disorders of teeth and supporting structures: Secondary | ICD-10-CM | POA: Diagnosis not present

## 2017-05-02 DIAGNOSIS — I10 Essential (primary) hypertension: Secondary | ICD-10-CM | POA: Diagnosis not present

## 2017-05-02 DIAGNOSIS — Z7982 Long term (current) use of aspirin: Secondary | ICD-10-CM | POA: Diagnosis not present

## 2017-05-02 DIAGNOSIS — K047 Periapical abscess without sinus: Secondary | ICD-10-CM | POA: Insufficient documentation

## 2017-05-02 DIAGNOSIS — Z79899 Other long term (current) drug therapy: Secondary | ICD-10-CM | POA: Diagnosis not present

## 2017-05-02 DIAGNOSIS — J45909 Unspecified asthma, uncomplicated: Secondary | ICD-10-CM | POA: Insufficient documentation

## 2017-05-02 MED ORDER — PENICILLIN V POTASSIUM 500 MG PO TABS: 500.0000 mg | ORAL_TABLET | Freq: Four times a day (QID) | ORAL | 0 refills | Status: AC

## 2017-05-02 MED ORDER — PENICILLIN V POTASSIUM 250 MG PO TABS
500.0000 mg | ORAL_TABLET | Freq: Once | ORAL | Status: AC
  Administered 2017-05-02: 500 mg via ORAL
  Filled 2017-05-02: qty 2

## 2017-05-02 MED ORDER — HYDROCODONE-ACETAMINOPHEN 5-325 MG PO TABS
1.0000 | ORAL_TABLET | Freq: Once | ORAL | Status: AC
  Administered 2017-05-02: 1 via ORAL
  Filled 2017-05-02: qty 1

## 2017-05-02 MED ORDER — HYDROCODONE-ACETAMINOPHEN 5-325 MG PO TABS: 1.0000 | ORAL_TABLET | ORAL | 0 refills | Status: DC | PRN

## 2017-05-02 NOTE — ED Triage Notes (Signed)
Pt c/o R sided lower dental pain worsening yesterday. Has had ongoing dental pain for 10 years

## 2017-05-02 NOTE — ED Provider Notes (Signed)
[facility] DEPT Provider Note   CSN: [id number] Arrival date & time: 05/02/17  0453     History   Chief Complaint Chief Complaint  Patient presents with  . Dental Pain    HPI Christine Porter is a 66 y.o. female.  Patient complains of dental pain and facial swelling x 1 day. No fever, nausea or vomiting. No difficulty swallowing. She denies any recent dental issues.    The history is provided by the patient. No language interpreter was used.    Past Medical History:  Diagnosis Date  . Asthma   . Gout   . Hypertension   . Pneumonia     There are no active problems to display for this patient.   Past Surgical History:  Procedure Laterality Date  . CESAREAN SECTION      OB History    No data available       Home Medications    Prior to Admission medications   Medication Sig Start Date End Date Taking? Authorizing Provider  albuterol (PROVENTIL HFA;VENTOLIN HFA) 108 (90 BASE) MCG/ACT inhaler Inhale 1 puff into the lungs every 6 (six) hours as needed for wheezing or shortness of breath.    [provider]  albuterol (PROVENTIL) (2.5 MG/3ML) 0.083% nebulizer solution Take 3 mLs (2.5 mg total) by nebulization every 6 (six) hours as needed for wheezing or shortness of breath. 05/19/14   Alfonzo Beers, MD  aspirin EC 325 MG tablet Take 325 mg by mouth daily.    [provider]  diclofenac sodium (VOLTAREN) 1 % GEL Apply 2 g topically 4 (four) times daily. 03/19/16   Billy Fischer, MD  furosemide (LASIX) 40 MG tablet Take 40 mg by mouth daily as needed for fluid.     [provider]  hydrochlorothiazide (HYDRODIURIL) 25 MG tablet Take 1 tablet (25 mg total) by mouth daily. 05/22/15   Virgel Manifold, MD  hydrochlorothiazide (HYDRODIURIL) 25 MG tablet Take 1 tablet (25 mg total) by mouth daily. 08/22/15   Cartner, Marland Kitchen, PA-C  metoprolol (LOPRESSOR) 100 MG tablet Take 100 mg by mouth 2 (two) times daily.    [provider]    metoprolol (LOPRESSOR) 100 MG tablet Take 1 tablet (100 mg total) by mouth 2 (two) times daily. 05/22/15 05/21/16  Virgel Manifold, MD  metoprolol tartrate (LOPRESSOR) 100 MG tablet Take 1 tablet (100 mg total) by mouth once. 08/22/15   Cartner, Marland Kitchen, PA-C  oxyCODONE-acetaminophen (PERCOCET/ROXICET) 5-325 MG tablet Take 1-2 tablets by mouth every 4 (four) hours as needed for severe pain. 04/23/17   Bettey Costa, PA  potassium chloride 20 MEQ TBCR One a day 04/03/15   Fransico Meadow, PA-C  predniSONE (DELTASONE) 10 MG tablet Take 5 tablets (50 mg total) by mouth daily with breakfast. Take 50 mg tomorrow the 2nd day, 40 mg third and fourth day, 30 mg fifth and sixth day, 20 mg seventh and eighth day, 10 mg ninth and 10th day. 04/23/17   Bettey Costa, PA  sertraline (ZOLOFT) 50 MG tablet Take 50 mg by mouth daily.    [provider]  traZODone (DESYREL) 100 MG tablet Take 1 tablet (100 mg total) by mouth at bedtime. 07/27/12   Roselee Culver, MD  UNKNOWN TO PATIENT Another HTN med    [provider]    Family History No family history on file.  Social History Social History  Substance Use Topics  . Smoking status: Never Smoker  . Smokeless tobacco:  Never Used  . Alcohol use No     Allergies   Codeine   Review of Systems Review of Systems  Constitutional: Negative for fever.  HENT: Positive for dental problem and facial swelling. Negative for trouble swallowing.   Gastrointestinal: Negative for nausea.     Physical Exam Updated Vital Signs BP (!) 149/92 (BP Location: Right Arm)   Pulse 98   Temp 98.6 F (37 C) (Oral)   Resp 20   Ht 5\' 4"  (1.626 m)   Wt 90.7 kg (200 lb)   SpO2 100%   BMI 34.33 kg/m   Physical Exam  Constitutional: She is oriented to person, place, and time. She appears well-developed and well-nourished.  HENT:  Lower left dental pain. No visualized abscess along gum line. There is adjacent facial swelling  without discrete cutaneous abscess. Swelling extends to submental area. Oropharynx is benign.   Neck: Normal range of motion.  Pulmonary/Chest: Effort normal.  Neurological: She is alert and oriented to person, place, and time.  Skin: Skin is warm and dry.     ED Treatments / Results  Labs (all labs ordered are listed, but only abnormal results are displayed) Labs Reviewed - No data to display  EKG  EKG Interpretation None       Radiology No results found.  Procedures Procedures (including critical care time)  Medications Ordered in ED Medications  penicillin v potassium (VEETID) tablet 500 mg (not administered)  HYDROcodone-acetaminophen (NORCO/VICODIN) 5-325 MG per tablet 1 tablet (not administered)     Initial Impression / Assessment and Plan / ED Course  I have reviewed the triage vital signs and the nursing notes.  Pertinent labs & imaging results that were available during my care of the patient were reviewed by me and considered in my medical decision making (see chart for details).     Patient with lower left dental pain and correlating facial swelling. Suspect apical abscess. Will provide penicillin and pain relief. Encourage dental follow up.  Final Clinical Impressions(s) / ED Diagnoses   Final diagnoses:  None   1. Dental infection  New Prescriptions New Prescriptions   No medications on file     Charlann Lange, Hershal Coria 05/02/17 [id number]    Merryl Hacker, MD 05/02/17 [phone]

## 2017-05-06 ENCOUNTER — Encounter (HOSPITAL_COMMUNITY): Payer: Self-pay

## 2017-05-06 ENCOUNTER — Emergency Department (HOSPITAL_COMMUNITY)
Admission: EM | Admit: 2017-05-06 | Discharge: 2017-05-06 | Disposition: A | Discharge status: Home / Self Care | Payer: Medicare HMO | Attending: Emergency Medicine | Admitting: Emergency Medicine

## 2017-05-06 DIAGNOSIS — M25562 Pain in left knee: Secondary | ICD-10-CM | POA: Diagnosis present

## 2017-05-06 DIAGNOSIS — Z79899 Other long term (current) drug therapy: Secondary | ICD-10-CM | POA: Diagnosis not present

## 2017-05-06 DIAGNOSIS — Z7982 Long term (current) use of aspirin: Secondary | ICD-10-CM | POA: Diagnosis not present

## 2017-05-06 DIAGNOSIS — I10 Essential (primary) hypertension: Secondary | ICD-10-CM | POA: Diagnosis not present

## 2017-05-06 DIAGNOSIS — M109 Gout, unspecified: Secondary | ICD-10-CM | POA: Insufficient documentation

## 2017-05-06 DIAGNOSIS — J45909 Unspecified asthma, uncomplicated: Secondary | ICD-10-CM | POA: Diagnosis not present

## 2017-05-06 MED ORDER — COLCHICINE 0.6 MG PO TABS
1.2000 mg | ORAL_TABLET | Freq: Once | ORAL | Status: AC
Start: 2017-05-06 — End: 2017-05-06
  Administered 2017-05-06: 1.2 mg via ORAL
  Filled 2017-05-06: qty 2

## 2017-05-06 MED ORDER — HYDROCODONE-ACETAMINOPHEN 5-325 MG PO TABS: 1.0000 | ORAL_TABLET | Freq: Four times a day (QID) | ORAL | 0 refills | Status: DC | PRN

## 2017-05-06 MED ORDER — COLCHICINE 0.6 MG PO TABS: 0.6000 mg | ORAL_TABLET | Freq: Every day | ORAL | 0 refills | Status: DC

## 2017-05-06 MED ORDER — OXYCODONE-ACETAMINOPHEN 5-325 MG PO TABS
1.0000 | ORAL_TABLET | Freq: Once | ORAL | Status: AC
Start: 2017-05-06 — End: 2017-05-06
  Administered 2017-05-06: 1 via ORAL
  Filled 2017-05-06: qty 1

## 2017-05-06 NOTE — Discharge Instructions (Signed)
You have been seen today for knee pain. This may be due to gout. Take the colchicine for one day past when the pain resolves. The use ibuprofen, naproxen, Tylenol for pain. Vicodin for severe pain. Not drive or perform other dangerous activities while taking the Vicodin. Follow up with your primary care provider on this matter. You may need to follow up with an orthopedic specialist if this kind of pain keeps recurring. Return to the ED should symptoms worsen.

## 2017-05-06 NOTE — ED Notes (Signed)
Pt verbalized understanding discharge instructions and denies any further needs or questions at this time. VS stable, ambulatory and steady gait.   

## 2017-05-06 NOTE — ED Triage Notes (Signed)
Patient complains of left knee pain x 2 days, states it is her gout again, no trauma

## 2017-05-06 NOTE — ED Notes (Signed)
Scanner in room not working.

## 2017-05-06 NOTE — ED Provider Notes (Signed)
[facility] DEPT Provider Note   CSN: [id number] Arrival date & time: 05/06/17  1455     History   Chief Complaint No chief complaint on file.   HPI Christine Porter is a 66 y.o. female.  HPI    Christine Porter is a 66 y.o. female, with a history of HTN and gout, presenting to the ED with left knee pain beginning two days ago.  Pain is throbbing, rated 10/10, nonradiating. States she has had similar pain in this knee in the past and it was gout. She also has a history of gout in the left big toe. Has tried ibuprofen every 4 hours without relief. Denies fever/chills, neuro deficits, N/V, calf pain/swelling, or any other complaints.       Past Medical History:  Diagnosis Date  . Asthma   . Gout   . Hypertension   . Pneumonia     There are no active problems to display for this patient.   Past Surgical History:  Procedure Laterality Date  . CESAREAN SECTION      OB History    No data available       Home Medications    Prior to Admission medications   Medication Sig Start Date End Date Taking? Authorizing Provider  albuterol (PROVENTIL HFA;VENTOLIN HFA) 108 (90 BASE) MCG/ACT inhaler Inhale 1 puff into the lungs every 6 (six) hours as needed for wheezing or shortness of breath.    [provider]  albuterol (PROVENTIL) (2.5 MG/3ML) 0.083% nebulizer solution Take 3 mLs (2.5 mg total) by nebulization every 6 (six) hours as needed for wheezing or shortness of breath. 05/19/14   Alfonzo Beers, MD  aspirin EC 325 MG tablet Take 325 mg by mouth daily.    [provider]  colchicine 0.6 MG tablet Take 1 tablet (0.6 mg total) by mouth daily. 05/06/17   Eevie Lapp C, PA-C  diclofenac sodium (VOLTAREN) 1 % GEL Apply 2 g topically 4 (four) times daily. 03/19/16   Billy Fischer, MD  furosemide (LASIX) 40 MG tablet Take 40 mg by mouth daily as needed for fluid.     [provider]  hydrochlorothiazide (HYDRODIURIL) 25 MG tablet Take 1 tablet (25 mg  total) by mouth daily. 05/22/15   Virgel Manifold, MD  hydrochlorothiazide (HYDRODIURIL) 25 MG tablet Take 1 tablet (25 mg total) by mouth daily. 08/22/15   Cartner, Marland Kitchen, PA-C  HYDROcodone-acetaminophen (NORCO/VICODIN) 5-325 MG tablet Take 1 tablet by mouth every 4 (four) hours as needed. 05/02/17   Charlann Lange, PA-C  HYDROcodone-acetaminophen (NORCO/VICODIN) 5-325 MG tablet Take 1 tablet by mouth every 6 (six) hours as needed for severe pain. 05/06/17   Chrissie Dacquisto C, PA-C  metoprolol (LOPRESSOR) 100 MG tablet Take 100 mg by mouth 2 (two) times daily.    [provider]  metoprolol (LOPRESSOR) 100 MG tablet Take 1 tablet (100 mg total) by mouth 2 (two) times daily. 05/22/15 05/21/16  Virgel Manifold, MD  metoprolol tartrate (LOPRESSOR) 100 MG tablet Take 1 tablet (100 mg total) by mouth once. 08/22/15   Cartner, Marland Kitchen, PA-C  oxyCODONE-acetaminophen (PERCOCET/ROXICET) 5-325 MG tablet Take 1-2 tablets by mouth every 4 (four) hours as needed for severe pain. 04/23/17   Bettey Costa, PA  penicillin v potassium (VEETID) 500 MG tablet Take 1 tablet (500 mg total) by mouth 4 (four) times daily. 05/02/17 05/12/17  Charlann Lange, PA-C  potassium chloride 20 MEQ TBCR One a day 04/03/15   Fransico Meadow, PA-C  predniSONE (DELTASONE) 10 MG tablet Take 5 tablets (50 mg total) by mouth daily with breakfast. Take 50 mg tomorrow the 2nd day, 40 mg third and fourth day, 30 mg fifth and sixth day, 20 mg seventh and eighth day, 10 mg ninth and 10th day. 04/23/17   Bettey Costa, PA  sertraline (ZOLOFT) 50 MG tablet Take 50 mg by mouth daily.    [provider]  traZODone (DESYREL) 100 MG tablet Take 1 tablet (100 mg total) by mouth at bedtime. 07/27/12   Roselee Culver, MD  UNKNOWN TO PATIENT Another HTN med    [provider]    Family History No family history on file.  Social History Social History  Substance Use Topics  . Smoking status: Never Smoker  .  Smokeless tobacco: Never Used  . Alcohol use No     Allergies   Codeine   Review of Systems Review of Systems  Constitutional: Negative for fever.  Gastrointestinal: Negative for nausea and vomiting.  Musculoskeletal: Positive for arthralgias and joint swelling.  Skin: Negative for color change.  Neurological: Negative for weakness and numbness.     Physical Exam Updated Vital Signs BP (!) 151/96   Pulse (!) 106   Temp 99 F (37.2 C) (Oral)   Resp 18   SpO2 98%   Physical Exam  Constitutional: She appears well-developed and well-nourished. No distress.  HENT:  Head: Normocephalic and atraumatic.  Eyes: Conjunctivae are normal.  Neck: Neck supple.  Cardiovascular: Normal rate, regular rhythm and intact distal pulses.   Pulmonary/Chest: Effort normal.  Musculoskeletal: She exhibits edema and tenderness.  Tenderness with minor swelling and minimal increased warmth noted to the left knee. Full active and passive ROM in left knee.  No laxity, deformity, or erythema noted.   Neurological: She is alert.  Skin: Skin is warm and dry. Capillary refill takes less than 2 seconds. She is not diaphoretic.  Psychiatric: She has a normal mood and affect. Her behavior is normal.  Nursing note and vitals reviewed.    ED Treatments / Results  Labs (all labs ordered are listed, but only abnormal results are displayed) Labs Reviewed - No data to display  EKG  EKG Interpretation None       Radiology No results found.  Procedures Procedures (including critical care time)  Medications Ordered in ED Medications  oxyCODONE-acetaminophen (PERCOCET/ROXICET) 5-325 MG per tablet 1 tablet (1 tablet Oral Given 05/06/17 1646)  colchicine tablet 1.2 mg (1.2 mg Oral Given 05/06/17 1646)     Initial Impression / Assessment and Plan / ED Course  I have reviewed the triage vital signs and the nursing notes.  Pertinent labs & imaging results that were available during my care of the  patient were reviewed by me and considered in my medical decision making (see chart for details).      Patient presents with left knee pain. Low suspicion for DVT. Low suspicion for septic joint based on history and presentation. Shared decision making was used regarding arthrocentesis. Patient listened to the options, voiced understanding, and opted against joint aspiration and presumptive treatment for gout. I think this is reasonable. No history of septic joint, HIV, gonorrhea, DM, DVT/PE. PCP versus orthopedic follow-up. The patient was given instructions for home care as well as return precautions. Patient voices understanding of these instructions, accepts the plan, and is comfortable with discharge.  Findings and plan of care discussed with Dorie Rank, MD. Dr. Tomi Bamberger personally evaluated and examined this  patient.  Final Clinical Impressions(s) / ED Diagnoses   Final diagnoses:  Acute gout of left knee, unspecified cause    New Prescriptions Discharge Medication List as of 05/06/2017  4:58 PM    START taking these medications   Details  colchicine 0.6 MG tablet Take 1 tablet (0.6 mg total) by mouth daily., Starting Sun 05/06/2017, Print    !! HYDROcodone-acetaminophen (NORCO/VICODIN) 5-325 MG tablet Take 1 tablet by mouth every 6 (six) hours as needed for severe pain., Starting Sun 05/06/2017, Print     !! - Potential duplicate medications found. Please discuss with provider.       Lorayne Bender, PA-C 05/08/17 0150    Dorie Rank, MD 05/09/17 [phone]

## 2017-05-11 DIAGNOSIS — R69 Illness, unspecified: Secondary | ICD-10-CM | POA: Diagnosis not present

## 2017-05-15 DIAGNOSIS — I10 Essential (primary) hypertension: Secondary | ICD-10-CM | POA: Diagnosis not present

## 2017-05-15 DIAGNOSIS — M109 Gout, unspecified: Secondary | ICD-10-CM | POA: Diagnosis not present

## 2017-05-15 DIAGNOSIS — Z9289 Personal history of other medical treatment: Secondary | ICD-10-CM | POA: Diagnosis not present

## 2017-05-15 DIAGNOSIS — E669 Obesity, unspecified: Secondary | ICD-10-CM | POA: Diagnosis not present

## 2017-05-18 DIAGNOSIS — R69 Illness, unspecified: Secondary | ICD-10-CM | POA: Diagnosis not present

## 2017-06-01 DIAGNOSIS — R69 Illness, unspecified: Secondary | ICD-10-CM | POA: Diagnosis not present

## 2017-06-08 DIAGNOSIS — R69 Illness, unspecified: Secondary | ICD-10-CM | POA: Diagnosis not present

## 2017-06-14 DIAGNOSIS — R69 Illness, unspecified: Secondary | ICD-10-CM | POA: Diagnosis not present

## 2017-06-15 ENCOUNTER — Emergency Department (HOSPITAL_COMMUNITY)
Admission: EM | Admit: 2017-06-15 | Discharge: 2017-06-15 | Disposition: A | Discharge status: Home / Self Care | Payer: Medicare HMO | Attending: Emergency Medicine | Admitting: Emergency Medicine

## 2017-06-15 ENCOUNTER — Encounter (HOSPITAL_COMMUNITY): Payer: Self-pay | Admitting: Emergency Medicine

## 2017-06-15 DIAGNOSIS — Z7982 Long term (current) use of aspirin: Secondary | ICD-10-CM | POA: Diagnosis not present

## 2017-06-15 DIAGNOSIS — L02212 Cutaneous abscess of back [any part, except buttock]: Secondary | ICD-10-CM

## 2017-06-15 DIAGNOSIS — M545 Low back pain: Secondary | ICD-10-CM | POA: Diagnosis present

## 2017-06-15 DIAGNOSIS — I10 Essential (primary) hypertension: Secondary | ICD-10-CM | POA: Diagnosis not present

## 2017-06-15 DIAGNOSIS — Z79899 Other long term (current) drug therapy: Secondary | ICD-10-CM | POA: Diagnosis not present

## 2017-06-15 DIAGNOSIS — J45909 Unspecified asthma, uncomplicated: Secondary | ICD-10-CM | POA: Insufficient documentation

## 2017-06-15 MED ORDER — SULFAMETHOXAZOLE-TRIMETHOPRIM 800-160 MG PO TABS: 1.0000 | ORAL_TABLET | Freq: Two times a day (BID) | ORAL | 0 refills | Status: AC

## 2017-06-15 NOTE — ED Triage Notes (Signed)
Pt reports feeling something bite her about 4 days ago, thinks it was a spider. Pt has small abscess to upper right back.

## 2017-06-15 NOTE — Discharge Instructions (Signed)
Please read and follow all provided instructions.  Your diagnoses today include:  1. Abscess of back    Tests performed today include: Vital signs. See below for your results today.   Medications prescribed:   Take any prescribed medications only as directed.   Home care instructions:  Follow any educational materials contained in this packet  Follow-up instructions: Please follow-up with your primary care provider in the next 1 week for further evaluation of your symptoms.   Return instructions:  Return to the Emergency Department if you have: Fever Worsening symptoms Worsening pain Worsening swelling Redness of the skin that moves away from the affected area, especially if it streaks away from the affected area  Any other emergent concerns  Additional Information: If you have recurrent abscesses, try both the following. Use a Qtip to apply an over-the-counter antibiotic to the inside of your nostrils, twice a day for 5 days. Wash your body with over-the-counter Hibaclens once a day for one week and then once every two weeks. This can reduce the amount of bacterial on your skin that causes boils and lead to fewer boils. If you continue to have multiple or recurrent boils, you should see a dermatologist (skin doctor).   Your vital signs today were: BP (!) 148/95    Pulse 79    Temp 98 F (36.7 C) (Oral)    Resp 18    SpO2 99%  If your blood pressure (BP) was elevated above 135/85 this visit, please have this repeated by your doctor within one month. --------------

## 2017-06-15 NOTE — ED Provider Notes (Signed)
[facility] DEPT Provider Note   CSN: [id number] Arrival date & time: 06/15/17  1021  By signing my name below, I, Christine Porter, attest that this documentation has been prepared under the direction and in the presence of Christine Decamp, PA-C. Electronically Signed: Dora Porter, Scribe. 06/15/2017. 11:17 AM.  History   Chief Complaint Chief Complaint  Patient presents with  . Insect Bite   The history is provided by the patient. No language interpreter was used.    HPI Comments: Christine Porter is a 66 y.o. female who presents to the Emergency Department complaining of a moderate, gradually worsening area of pain and swelling to her right lower back for five days. She believes she was bitten by an insect but has not visualized any insects on her person. The pain is worse with palpation. She has tried cleaning the wound with peroxide and applying warm compresses without improvement. Patient also notes that her daughter has tried to puncture the wound with a sterile needle without relief. She denies fevers, chills, or any other associated symptoms.  Past Medical History:  Diagnosis Date  . Asthma   . Gout   . Hypertension   . Pneumonia     There are no active problems to display for this patient.   Past Surgical History:  Procedure Laterality Date  . CESAREAN SECTION      OB History    No data available       Home Medications    Prior to Admission medications   Medication Sig Start Date End Date Taking? Authorizing Provider  albuterol (PROVENTIL HFA;VENTOLIN HFA) 108 (90 BASE) MCG/ACT inhaler Inhale 1 puff into the lungs every 6 (six) hours as needed for wheezing or shortness of breath.    [provider]  albuterol (PROVENTIL) (2.5 MG/3ML) 0.083% nebulizer solution Take 3 mLs (2.5 mg total) by nebulization every 6 (six) hours as needed for wheezing or shortness of breath. 05/19/14   Alfonzo Beers, MD  aspirin EC 325 MG tablet Take 325 mg by mouth daily.     [provider]  colchicine 0.6 MG tablet Take 1 tablet (0.6 mg total) by mouth daily. 05/06/17   Joy, Shawn C, PA-C  diclofenac sodium (VOLTAREN) 1 % GEL Apply 2 g topically 4 (four) times daily. 03/19/16   Billy Fischer, MD  furosemide (LASIX) 40 MG tablet Take 40 mg by mouth daily as needed for fluid.     [provider]  hydrochlorothiazide (HYDRODIURIL) 25 MG tablet Take 1 tablet (25 mg total) by mouth daily. 05/22/15   Virgel Manifold, MD  hydrochlorothiazide (HYDRODIURIL) 25 MG tablet Take 1 tablet (25 mg total) by mouth daily. 08/22/15   Cartner, Marland Kitchen, PA-C  HYDROcodone-acetaminophen (NORCO/VICODIN) 5-325 MG tablet Take 1 tablet by mouth every 4 (four) hours as needed. 05/02/17   Charlann Lange, PA-C  HYDROcodone-acetaminophen (NORCO/VICODIN) 5-325 MG tablet Take 1 tablet by mouth every 6 (six) hours as needed for severe pain. 05/06/17   Joy, Shawn C, PA-C  metoprolol (LOPRESSOR) 100 MG tablet Take 100 mg by mouth 2 (two) times daily.    [provider]  metoprolol (LOPRESSOR) 100 MG tablet Take 1 tablet (100 mg total) by mouth 2 (two) times daily. 05/22/15 05/21/16  Virgel Manifold, MD  metoprolol tartrate (LOPRESSOR) 100 MG tablet Take 1 tablet (100 mg total) by mouth once. 08/22/15   Cartner, Marland Kitchen, PA-C  oxyCODONE-acetaminophen (PERCOCET/ROXICET) 5-325 MG tablet Take 1-2 tablets by mouth every 4 (four) hours as needed for  severe pain. 04/23/17   Bettey Costa, PA  potassium chloride 20 MEQ TBCR One a day 04/03/15   Fransico Meadow, PA-C  predniSONE (DELTASONE) 10 MG tablet Take 5 tablets (50 mg total) by mouth daily with breakfast. Take 50 mg tomorrow the 2nd day, 40 mg third and fourth day, 30 mg fifth and sixth day, 20 mg seventh and eighth day, 10 mg ninth and 10th day. 04/23/17   Bettey Costa, PA  sertraline (ZOLOFT) 50 MG tablet Take 50 mg by mouth daily.    [provider]  traZODone (DESYREL) 100 MG tablet Take 1 tablet (100 mg  total) by mouth at bedtime. 07/27/12   Roselee Culver, MD  UNKNOWN TO PATIENT Another HTN med    [provider]    Family History No family history on file.  Social History Social History  Substance Use Topics  . Smoking status: Never Smoker  . Smokeless tobacco: Never Used  . Alcohol use No     Allergies   Codeine   Review of Systems Review of Systems  Constitutional: Negative for chills and fever.  Skin: Positive for wound.   Physical Exam Updated Vital Signs BP (!) 148/95   Pulse 79   Temp 98 F (36.7 C) (Oral)   Resp 18   SpO2 99%   Physical Exam  Constitutional: She is oriented to person, place, and time. She appears well-developed and well-nourished. No distress.  HENT:  Head: Normocephalic and atraumatic.  Eyes: Conjunctivae and EOM are normal.  Neck: Neck supple. No tracheal deviation present.  Cardiovascular: Normal rate.   Pulmonary/Chest: Effort normal. No respiratory distress.  Musculoskeletal: Normal range of motion.  Neurological: She is alert and oriented to person, place, and time.  Skin: Skin is warm and dry.  <0.5 cm abscess on the right lower back. Area is fluctuant with visible pustule head. No surrounding erythema. No signs of significant infection.   Psychiatric: She has a normal mood and affect. Her behavior is normal.  Nursing note and vitals reviewed.  ED Treatments / Results  Labs (all labs ordered are listed, but only abnormal results are displayed) Labs Reviewed - No data to display  EKG  EKG Interpretation None       Radiology No results found.  Procedures Procedures (including critical care time)  DIAGNOSTIC STUDIES: Oxygen Saturation is 99% on RA, normal by my interpretation.    COORDINATION OF CARE: 11:15 AM Discussed treatment plan with pt at bedside and pt agreed to plan.  Medications Ordered in ED Medications - No data to display   Initial Impression / Assessment and Plan / ED Course  I have  reviewed the triage vital signs and the nursing notes.  Pertinent labs & imaging results that were available during my care of the patient were reviewed by me and considered in my medical decision making (see chart for details).        {I have reviewed the relevant previous healthcare records.  {I obtained HPI from historian.   ED Course:  Assessment: FRANCES AMBROSINO is a 66 y.o. female who presents to ED for abscess. Abscess is small enough to treat with warm compresses. Will give Rx for antibiotics. No evidence of surrounding erythema to suggest cellulitis. Patient was prescribed bactrim. Wound care instructions discussed. Return to ER if concern for spread of infection, increasing pain, fevers or other concerns. All questions answered.  Disposition/Plan:  DC Home Additional Verbal discharge instructions given and discussed  with patient.  Pt Instructed to f/u with PCP in the next week for evaluation and treatment of symptoms. Return precautions given Pt acknowledges and agrees with plan  Supervising Physician Drenda Freeze, MD  Final Clinical Impressions(s) / ED Diagnoses   Final diagnoses:  Abscess of back    New Prescriptions New Prescriptions   No medications on file   I personally performed the services described in this documentation, which was scribed in my presence. The recorded information has been reviewed and is accurate.    Christine Decamp, PA-C 06/15/17 1129    Drenda Freeze, MD 06/15/17 [phone]

## 2017-06-22 DIAGNOSIS — R69 Illness, unspecified: Secondary | ICD-10-CM | POA: Diagnosis not present

## 2017-07-06 DIAGNOSIS — R69 Illness, unspecified: Secondary | ICD-10-CM | POA: Diagnosis not present

## 2017-07-27 DIAGNOSIS — R69 Illness, unspecified: Secondary | ICD-10-CM | POA: Diagnosis not present

## 2017-08-17 DIAGNOSIS — E669 Obesity, unspecified: Secondary | ICD-10-CM | POA: Diagnosis not present

## 2017-08-17 DIAGNOSIS — R69 Illness, unspecified: Secondary | ICD-10-CM | POA: Diagnosis not present

## 2017-08-17 DIAGNOSIS — R4589 Other symptoms and signs involving emotional state: Secondary | ICD-10-CM | POA: Diagnosis not present

## 2017-08-17 DIAGNOSIS — I10 Essential (primary) hypertension: Secondary | ICD-10-CM | POA: Diagnosis not present

## 2017-08-17 DIAGNOSIS — Z09 Encounter for follow-up examination after completed treatment for conditions other than malignant neoplasm: Secondary | ICD-10-CM | POA: Diagnosis not present

## 2017-08-17 DIAGNOSIS — H6120 Impacted cerumen, unspecified ear: Secondary | ICD-10-CM | POA: Diagnosis not present

## 2017-08-27 DIAGNOSIS — R69 Illness, unspecified: Secondary | ICD-10-CM | POA: Diagnosis not present

## 2017-08-31 DIAGNOSIS — R69 Illness, unspecified: Secondary | ICD-10-CM | POA: Diagnosis not present

## 2017-09-14 DIAGNOSIS — R69 Illness, unspecified: Secondary | ICD-10-CM | POA: Diagnosis not present

## 2017-09-21 DIAGNOSIS — R69 Illness, unspecified: Secondary | ICD-10-CM | POA: Diagnosis not present

## 2017-09-28 DIAGNOSIS — R69 Illness, unspecified: Secondary | ICD-10-CM | POA: Diagnosis not present

## 2017-10-05 DIAGNOSIS — R69 Illness, unspecified: Secondary | ICD-10-CM | POA: Diagnosis not present

## 2017-12-24 DIAGNOSIS — G4700 Insomnia, unspecified: Secondary | ICD-10-CM | POA: Diagnosis not present

## 2017-12-24 DIAGNOSIS — R69 Illness, unspecified: Secondary | ICD-10-CM | POA: Diagnosis not present

## 2017-12-24 DIAGNOSIS — I10 Essential (primary) hypertension: Secondary | ICD-10-CM | POA: Diagnosis not present

## 2017-12-24 DIAGNOSIS — M109 Gout, unspecified: Secondary | ICD-10-CM | POA: Diagnosis not present

## 2017-12-24 DIAGNOSIS — F419 Anxiety disorder, unspecified: Secondary | ICD-10-CM | POA: Diagnosis not present

## 2017-12-27 DIAGNOSIS — R69 Illness, unspecified: Secondary | ICD-10-CM | POA: Diagnosis not present

## 2018-01-21 DIAGNOSIS — R69 Illness, unspecified: Secondary | ICD-10-CM | POA: Diagnosis not present

## 2018-01-22 DIAGNOSIS — R69 Illness, unspecified: Secondary | ICD-10-CM | POA: Diagnosis not present

## 2018-02-04 DIAGNOSIS — M109 Gout, unspecified: Secondary | ICD-10-CM | POA: Diagnosis not present

## 2018-02-04 DIAGNOSIS — I10 Essential (primary) hypertension: Secondary | ICD-10-CM | POA: Diagnosis not present

## 2018-02-04 DIAGNOSIS — Z8249 Family history of ischemic heart disease and other diseases of the circulatory system: Secondary | ICD-10-CM | POA: Diagnosis not present

## 2018-02-04 DIAGNOSIS — M25561 Pain in right knee: Secondary | ICD-10-CM | POA: Diagnosis not present

## 2018-02-04 DIAGNOSIS — E559 Vitamin D deficiency, unspecified: Secondary | ICD-10-CM | POA: Diagnosis not present

## 2018-02-12 DIAGNOSIS — R69 Illness, unspecified: Secondary | ICD-10-CM | POA: Diagnosis not present

## 2018-02-25 DIAGNOSIS — Z719 Counseling, unspecified: Secondary | ICD-10-CM | POA: Diagnosis not present

## 2018-02-25 DIAGNOSIS — Z713 Dietary counseling and surveillance: Secondary | ICD-10-CM | POA: Diagnosis not present

## 2018-02-25 DIAGNOSIS — R69 Illness, unspecified: Secondary | ICD-10-CM | POA: Diagnosis not present

## 2018-02-25 DIAGNOSIS — I10 Essential (primary) hypertension: Secondary | ICD-10-CM | POA: Diagnosis not present

## 2018-02-25 DIAGNOSIS — E669 Obesity, unspecified: Secondary | ICD-10-CM | POA: Diagnosis not present

## 2018-03-06 DIAGNOSIS — R69 Illness, unspecified: Secondary | ICD-10-CM | POA: Diagnosis not present

## 2018-04-03 DIAGNOSIS — H5789 Other specified disorders of eye and adnexa: Secondary | ICD-10-CM | POA: Diagnosis not present

## 2018-04-03 DIAGNOSIS — M795 Residual foreign body in soft tissue: Secondary | ICD-10-CM | POA: Diagnosis not present

## 2018-04-03 DIAGNOSIS — I10 Essential (primary) hypertension: Secondary | ICD-10-CM | POA: Diagnosis not present

## 2018-04-03 DIAGNOSIS — R52 Pain, unspecified: Secondary | ICD-10-CM | POA: Diagnosis not present

## 2018-04-05 DIAGNOSIS — R69 Illness, unspecified: Secondary | ICD-10-CM | POA: Diagnosis not present

## 2018-05-10 DIAGNOSIS — R69 Illness, unspecified: Secondary | ICD-10-CM | POA: Diagnosis not present

## 2018-06-14 DIAGNOSIS — R69 Illness, unspecified: Secondary | ICD-10-CM | POA: Diagnosis not present

## 2018-07-15 DIAGNOSIS — R69 Illness, unspecified: Secondary | ICD-10-CM | POA: Diagnosis not present

## 2018-08-05 DIAGNOSIS — I10 Essential (primary) hypertension: Secondary | ICD-10-CM | POA: Diagnosis not present

## 2018-08-05 DIAGNOSIS — R69 Illness, unspecified: Secondary | ICD-10-CM | POA: Diagnosis not present

## 2018-08-05 DIAGNOSIS — E790 Hyperuricemia without signs of inflammatory arthritis and tophaceous disease: Secondary | ICD-10-CM | POA: Diagnosis not present

## 2018-08-05 DIAGNOSIS — H209 Unspecified iridocyclitis: Secondary | ICD-10-CM | POA: Diagnosis not present

## 2018-08-12 DIAGNOSIS — R69 Illness, unspecified: Secondary | ICD-10-CM | POA: Diagnosis not present

## 2018-08-17 ENCOUNTER — Encounter (HOSPITAL_COMMUNITY): Payer: Self-pay | Admitting: Emergency Medicine

## 2018-08-17 ENCOUNTER — Ambulatory Visit (HOSPITAL_COMMUNITY)
Admission: EM | Admit: 2018-08-17 | Discharge: 2018-08-17 | Disposition: A | Discharge status: Home / Self Care | Payer: Medicare HMO | Attending: Family Medicine | Admitting: Family Medicine

## 2018-08-17 DIAGNOSIS — L2082 Flexural eczema: Secondary | ICD-10-CM

## 2018-08-17 DIAGNOSIS — H1033 Unspecified acute conjunctivitis, bilateral: Secondary | ICD-10-CM

## 2018-08-17 MED ORDER — ERYTHROMYCIN 5 MG/GM OP OINT: TOPICAL_OINTMENT | OPHTHALMIC | 0 refills | Status: DC

## 2018-08-17 MED ORDER — TRIAMCINOLONE ACETONIDE 0.1 % EX CREA: 1.0000 "application " | TOPICAL_CREAM | Freq: Two times a day (BID) | CUTANEOUS | 0 refills | Status: AC

## 2018-08-17 MED ORDER — AMOXICILLIN-POT CLAVULANATE 875-125 MG PO TABS: 1.0000 | ORAL_TABLET | Freq: Two times a day (BID) | ORAL | 0 refills | Status: AC

## 2018-08-17 NOTE — ED Provider Notes (Signed)
[place]    CSN: [id number] Arrival date & time: 08/17/18  1611     History   Chief Complaint Chief Complaint  Patient presents with  . Eye Pain    HPI Christine Porter is a 67 y.o. female history of asthma, hypertension presenting today for evaluation of bilateral eye irritation and drainage.  Patient states over the past 2 to 3 weeks she has had a thick drainage out of both eyes.  She is also had some mild redness.  More recently she is felt like the infection is spread to her nose and has had some tenderness and swelling to her nose.  She is concerned about cellulitis.  She denies any eye pain, changes in vision.  Patient does not wear contacts, but does wear glasses.  She denies change in vision, but occasional difficulty focusing eyes.  Denies eye swelling.  States that she tried some prednisone eyedrops that was given to her by her optometrist.  Denies associated URI symptoms of congestion, sore throat, cough.  HPI  Past Medical History:  Diagnosis Date  . Asthma   . Gout   . Hypertension   . Pneumonia     There are no active problems to display for this patient.   Past Surgical History:  Procedure Laterality Date  . CESAREAN SECTION      OB History   None      Home Medications    Prior to Admission medications   Medication Sig Start Date End Date Taking? Authorizing Provider  albuterol (PROVENTIL HFA;VENTOLIN HFA) 108 (90 BASE) MCG/ACT inhaler Inhale 1 puff into the lungs every 6 (six) hours as needed for wheezing or shortness of breath.    [provider]  albuterol (PROVENTIL) (2.5 MG/3ML) 0.083% nebulizer solution Take 3 mLs (2.5 mg total) by nebulization every 6 (six) hours as needed for wheezing or shortness of breath. 05/19/14   Mabe, Forbes Cellar, MD  amoxicillin-clavulanate (AUGMENTIN) 875-125 MG tablet Take 1 tablet by mouth every 12 (twelve) hours for 7 days. 08/17/18 08/24/18  Aayushi Solorzano C, PA-C  aspirin EC 325 MG tablet Take 325  mg by mouth daily.    [provider]  colchicine 0.6 MG tablet Take 1 tablet (0.6 mg total) by mouth daily. 05/06/17   Joy, Shawn C, PA-C  diclofenac sodium (VOLTAREN) 1 % GEL Apply 2 g topically 4 (four) times daily. 03/19/16   Billy Fischer, MD  erythromycin ophthalmic ointment Place a 1/2 inch ribbon of ointment into the lower eyelid 4 times a day 08/17/18   Maddon Horton C, PA-C  furosemide (LASIX) 40 MG tablet Take 40 mg by mouth daily as needed for fluid.     [provider]  hydrochlorothiazide (HYDRODIURIL) 25 MG tablet Take 1 tablet (25 mg total) by mouth daily. 05/22/15   Virgel Manifold, MD  hydrochlorothiazide (HYDRODIURIL) 25 MG tablet Take 1 tablet (25 mg total) by mouth daily. 08/22/15   Cartner, Marland Kitchen, PA-C  HYDROcodone-acetaminophen (NORCO/VICODIN) 5-325 MG tablet Take 1 tablet by mouth every 4 (four) hours as needed. 05/02/17   Charlann Lange, PA-C  HYDROcodone-acetaminophen (NORCO/VICODIN) 5-325 MG tablet Take 1 tablet by mouth every 6 (six) hours as needed for severe pain. 05/06/17   Joy, Shawn C, PA-C  metoprolol (LOPRESSOR) 100 MG tablet Take 100 mg by mouth 2 (two) times daily.    [provider]  metoprolol (LOPRESSOR) 100 MG tablet Take 1 tablet (100 mg total) by mouth 2 (two) times daily.  05/22/15 05/21/16  Virgel Manifold, MD  metoprolol tartrate (LOPRESSOR) 100 MG tablet Take 1 tablet (100 mg total) by mouth once. 08/22/15   Cartner, Marland Kitchen, PA-C  oxyCODONE-acetaminophen (PERCOCET/ROXICET) 5-325 MG tablet Take 1-2 tablets by mouth every 4 (four) hours as needed for severe pain. 04/23/17   Bettey Costa, PA  potassium chloride 20 MEQ TBCR One a day 04/03/15   Fransico Meadow, PA-C  predniSONE (DELTASONE) 10 MG tablet Take 5 tablets (50 mg total) by mouth daily with breakfast. Take 50 mg tomorrow the 2nd day, 40 mg third and fourth day, 30 mg fifth and sixth day, 20 mg seventh and eighth day, 10 mg ninth and 10th day. 04/23/17   Bettey Costa, PA  sertraline (ZOLOFT) 50 MG tablet Take 50 mg by mouth daily.    [provider]  traZODone (DESYREL) 100 MG tablet Take 1 tablet (100 mg total) by mouth at bedtime. 07/27/12   Roselee Culver, MD  triamcinolone cream (KENALOG) 0.1 % Apply 1 application topically 2 (two) times daily. 08/17/18   Antuan Limes C, PA-C  UNKNOWN TO PATIENT Another HTN med    [provider]    Family History History reviewed. No pertinent family history.  Social History Social History   Tobacco Use  . Smoking status: Never Smoker  . Smokeless tobacco: Never Used  Substance Use Topics  . Alcohol use: No  . Drug use: No     Allergies   Codeine   Review of Systems Review of Systems  Constitutional: Negative for activity change, appetite change, chills, fatigue and fever.  HENT: Positive for facial swelling. Negative for congestion, ear pain, rhinorrhea, sinus pressure, sore throat and trouble swallowing.   Eyes: Positive for discharge. Negative for photophobia, pain, redness, itching and visual disturbance.  Respiratory: Negative for cough, chest tightness and shortness of breath.   Cardiovascular: Negative for chest pain.  Gastrointestinal: Negative for abdominal pain, diarrhea, nausea and vomiting.  Musculoskeletal: Negative for myalgias.  Skin: Negative for rash.  Neurological: Negative for dizziness, light-headedness and headaches.     Physical Exam Triage Vital Signs ED Triage Vitals [08/17/18 1712]  Enc Vitals Group     BP (!) 166/102     Pulse Rate 95     Resp 18     Temp 98.2 F (36.8 C)     Temp Source Oral     SpO2 100 %     Weight      Height      Head Circumference      Peak Flow      Pain Score      Pain Loc      Pain Edu?      Excl. in [place]?    No data found.  Updated Vital Signs BP (!) 166/102 (BP Location: Right Arm)   Pulse 95   Temp 98.2 F (36.8 C) (Oral)   Resp 18   SpO2 100%   Visual Acuity-with glasses Right Eye  Distance:  20/30 Left Eye Distance:  20/30 Bilateral Distance:  20/30  Right Eye Near:   Left Eye Near:    Bilateral Near:     Physical Exam  Constitutional: She appears well-developed and well-nourished. No distress.  HENT:  Head: Normocephalic and atraumatic.  Bilateral ears without tenderness to palpation of external auricle, tragus and mastoid, EAC's without erythema or swelling, TM's with good bony landmarks and cone of light. Non erythematous.  Oral mucosa pink and moist, no  tonsillar enlargement or exudate. Posterior pharynx patent and nonerythematous, no uvula deviation or swelling. Normal phonation.  Mild erythema and swelling to the nose, mild tenderness to palpation  Eyes: Pupils are equal, round, and reactive to light. Conjunctivae and EOM are normal.  Bilateral eyes with mild conjunctival erythema, thick drainage seen on eyelashes bilaterally, red reflex present bilaterally  Neck: Neck supple.  Cardiovascular: Normal rate and regular rhythm.  No murmur heard. Pulmonary/Chest: Effort normal and breath sounds normal. No respiratory distress.  Breathing comfortably at rest, CTABL, no wheezing, rales or other adventitious sounds auscultated  Abdominal: She exhibits no distension.  Musculoskeletal: She exhibits no edema.  Neurological: She is alert.  Skin: Skin is warm and dry.  Patient has skin drying and peeling in bilateral EACs as well as around nasal area  Psychiatric: She has a normal mood and affect.  Nursing note and vitals reviewed.    UC Treatments / Results  Labs (all labs ordered are listed, but only abnormal results are displayed) Labs Reviewed - No data to display  EKG None  Radiology No results found.  Procedures Procedures (including critical care time)  Medications Ordered in UC Medications - No data to display  Initial Impression / Assessment and Plan / UC Course  I have reviewed the triage vital signs and the nursing notes.  Pertinent  labs & imaging results that were available during my care of the patient were reviewed by me and considered in my medical decision making (see chart for details).     Given length of symptoms and thick discharge will treat patient for bacterial conjunctivitis with erythromycin ointment, will also provide Augmentin to treat for possible cellulitis of nose.  Provided triamcinolone cream to apply to dry skin/eczematous areas.  Also discussed appropriate moisturizing measures, using thicker creams versus lotions.  Advised not to use cream more than twice a day and only to use a thin amount to avoid skin discoloration.Discussed strict return precautions. Patient verbalized understanding and is agreeable with plan.  Final Clinical Impressions(s) / UC Diagnoses   Final diagnoses:  Acute bacterial conjunctivitis of both eyes  Flexural eczema     Discharge Instructions     Please begin Augmentin twice daily for the next week Erythromycin ointment in lower lid 4 times a day May apply triamcinolone cream to dry areas on skin, please apply thin amount and no more than twice daily to avoid skin discoloration Apply thicker moisturizers like Eucerin or Cetaphil instead of lotions to help moisturize dry areas  Please follow-up if developing eye discomfort, pain, changes in vision, swelling, fevers, persistent or worsening symptoms.   ED Prescriptions    Medication Sig Dispense Auth. Provider   amoxicillin-clavulanate (AUGMENTIN) 875-125 MG tablet Take 1 tablet by mouth every 12 (twelve) hours for 7 days. 14 tablet Lonzo Saulter C, PA-C   erythromycin ophthalmic ointment Place a 1/2 inch ribbon of ointment into the lower eyelid 4 times a day 3.5 g Kate Larock C, PA-C   triamcinolone cream (KENALOG) 0.1 % Apply 1 application topically 2 (two) times daily. 30 g Oluwadamilare Tobler, [place] C, PA-C     Controlled Substance Prescriptions [place] Controlled Substance Registry consulted? Not Applicable   Janith Lima, [place] 08/17/18 1841

## 2018-08-17 NOTE — Discharge Instructions (Signed)
Please begin Augmentin twice daily for the next week Erythromycin ointment in lower lid 4 times a day May apply triamcinolone cream to dry areas on skin, please apply thin amount and no more than twice daily to avoid skin discoloration Apply thicker moisturizers like Eucerin or Cetaphil instead of lotions to help moisturize dry areas  Please follow-up if developing eye discomfort, pain, changes in vision, swelling, fevers, persistent or worsening symptoms.

## 2018-08-17 NOTE — ED Triage Notes (Signed)
Pt here for bilateral eye infection with some drainage

## 2018-08-19 DIAGNOSIS — I10 Essential (primary) hypertension: Secondary | ICD-10-CM | POA: Diagnosis not present

## 2018-08-19 DIAGNOSIS — Z833 Family history of diabetes mellitus: Secondary | ICD-10-CM | POA: Diagnosis not present

## 2018-08-19 DIAGNOSIS — E559 Vitamin D deficiency, unspecified: Secondary | ICD-10-CM | POA: Diagnosis not present

## 2018-09-02 DIAGNOSIS — I10 Essential (primary) hypertension: Secondary | ICD-10-CM | POA: Diagnosis not present

## 2018-09-02 DIAGNOSIS — E669 Obesity, unspecified: Secondary | ICD-10-CM | POA: Diagnosis not present

## 2018-09-05 DIAGNOSIS — R69 Illness, unspecified: Secondary | ICD-10-CM | POA: Diagnosis not present

## 2018-10-02 DIAGNOSIS — R69 Illness, unspecified: Secondary | ICD-10-CM | POA: Diagnosis not present

## 2018-10-15 DIAGNOSIS — R69 Illness, unspecified: Secondary | ICD-10-CM | POA: Diagnosis not present

## 2018-10-30 ENCOUNTER — Encounter (HOSPITAL_COMMUNITY): Payer: Self-pay

## 2018-10-30 ENCOUNTER — Ambulatory Visit (HOSPITAL_COMMUNITY)
Admission: EM | Admit: 2018-10-30 | Discharge: 2018-10-30 | Disposition: A | Discharge status: Home / Self Care | Payer: Medicare HMO | Attending: Family Medicine | Admitting: Family Medicine

## 2018-10-30 DIAGNOSIS — R062 Wheezing: Secondary | ICD-10-CM

## 2018-10-30 DIAGNOSIS — J069 Acute upper respiratory infection, unspecified: Secondary | ICD-10-CM | POA: Diagnosis not present

## 2018-10-30 DIAGNOSIS — H10503 Unspecified blepharoconjunctivitis, bilateral: Secondary | ICD-10-CM | POA: Diagnosis not present

## 2018-10-30 MED ORDER — BENZONATATE 100 MG PO CAPS: 100.0000 mg | ORAL_CAPSULE | Freq: Three times a day (TID) | ORAL | 0 refills | Status: DC | PRN

## 2018-10-30 MED ORDER — AMOXICILLIN 875 MG PO TABS: 875.0000 mg | ORAL_TABLET | Freq: Two times a day (BID) | ORAL | 0 refills | Status: DC

## 2018-10-30 MED ORDER — ERYTHROMYCIN 5 MG/GM OP OINT: TOPICAL_OINTMENT | OPHTHALMIC | 0 refills | Status: DC

## 2018-10-30 MED ORDER — ALBUTEROL SULFATE HFA 108 (90 BASE) MCG/ACT IN AERS: 1.0000 | INHALATION_SPRAY | Freq: Four times a day (QID) | RESPIRATORY_TRACT | 3 refills | Status: DC | PRN

## 2018-10-30 NOTE — ED Provider Notes (Signed)
[place]    CSN: [id number] Arrival date & time: 10/30/18  1325     History   Chief Complaint Chief Complaint  Patient presents with  . URI    HPI Christine Porter is a 67 y.o. female.   Established [place] urgent care pt presents with cold symptoms; congestion, nasal drainage, and persistent cough.      Past Medical History:  Diagnosis Date  . Asthma   . Gout   . Hypertension   . Pneumonia     There are no active problems to display for this patient.   Past Surgical History:  Procedure Laterality Date  . CESAREAN SECTION      OB History   None      Home Medications    Prior to Admission medications   Medication Sig Start Date End Date Taking? Authorizing Provider  albuterol (PROVENTIL HFA;VENTOLIN HFA) 108 (90 Base) MCG/ACT inhaler Inhale 1 puff into the lungs every 6 (six) hours as needed for wheezing or shortness of breath. 10/30/18   Robyn Haber, MD  albuterol (PROVENTIL) (2.5 MG/3ML) 0.083% nebulizer solution Take 3 mLs (2.5 mg total) by nebulization every 6 (six) hours as needed for wheezing or shortness of breath. 05/19/14   Mabe, Forbes Cellar, MD  amoxicillin (AMOXIL) 875 MG tablet Take 1 tablet (875 mg total) by mouth 2 (two) times daily. 10/30/18   Robyn Haber, MD  aspirin EC 325 MG tablet Take 325 mg by mouth daily.    [provider]  benzonatate (TESSALON) 100 MG capsule Take 1-2 capsules (100-200 mg total) by mouth 3 (three) times daily as needed for cough. 10/30/18   Robyn Haber, MD  colchicine 0.6 MG tablet Take 1 tablet (0.6 mg total) by mouth daily. 05/06/17   Joy, Shawn C, PA-C  diclofenac sodium (VOLTAREN) 1 % GEL Apply 2 g topically 4 (four) times daily. 03/19/16   Billy Fischer, MD  erythromycin ophthalmic ointment Place a 1/2 inch ribbon of ointment into the lower eyelid 4 times a day 10/30/18   Robyn Haber, MD  furosemide (LASIX) 40 MG tablet Take 40 mg by mouth daily as needed for fluid.      [provider]  hydrochlorothiazide (HYDRODIURIL) 25 MG tablet Take 1 tablet (25 mg total) by mouth daily. 05/22/15   Virgel Manifold, MD  hydrochlorothiazide (HYDRODIURIL) 25 MG tablet Take 1 tablet (25 mg total) by mouth daily. 08/22/15   Cartner, Marland Kitchen, PA-C  HYDROcodone-acetaminophen (NORCO/VICODIN) 5-325 MG tablet Take 1 tablet by mouth every 4 (four) hours as needed. 05/02/17   Charlann Lange, PA-C  HYDROcodone-acetaminophen (NORCO/VICODIN) 5-325 MG tablet Take 1 tablet by mouth every 6 (six) hours as needed for severe pain. 05/06/17   Joy, Shawn C, PA-C  metoprolol (LOPRESSOR) 100 MG tablet Take 100 mg by mouth 2 (two) times daily.    [provider]  metoprolol (LOPRESSOR) 100 MG tablet Take 1 tablet (100 mg total) by mouth 2 (two) times daily. 05/22/15 05/21/16  Virgel Manifold, MD  metoprolol tartrate (LOPRESSOR) 100 MG tablet Take 1 tablet (100 mg total) by mouth once. 08/22/15   Cartner, Marland Kitchen, PA-C  oxyCODONE-acetaminophen (PERCOCET/ROXICET) 5-325 MG tablet Take 1-2 tablets by mouth every 4 (four) hours as needed for severe pain. 04/23/17   Bettey Costa, PA  potassium chloride 20 MEQ TBCR One a day 04/03/15   Fransico Meadow, PA-C  predniSONE (DELTASONE) 10 MG tablet Take 5 tablets (50 mg total) by mouth daily with  breakfast. Take 50 mg tomorrow the 2nd day, 40 mg third and fourth day, 30 mg fifth and sixth day, 20 mg seventh and eighth day, 10 mg ninth and 10th day. 04/23/17   Bettey Costa, PA  sertraline (ZOLOFT) 50 MG tablet Take 50 mg by mouth daily.    [provider]  traZODone (DESYREL) 100 MG tablet Take 1 tablet (100 mg total) by mouth at bedtime. 07/27/12   Roselee Culver, MD  triamcinolone cream (KENALOG) 0.1 % Apply 1 application topically 2 (two) times daily. 08/17/18   Wieters, Hallie C, PA-C  UNKNOWN TO PATIENT Another HTN med    [provider]    Family History History reviewed. No pertinent family  history.  Social History Social History   Tobacco Use  . Smoking status: Never Smoker  . Smokeless tobacco: Never Used  Substance Use Topics  . Alcohol use: No  . Drug use: No     Allergies   Codeine and Erythromycin base   Review of Systems Review of Systems   Physical Exam Triage Vital Signs ED Triage Vitals  Enc Vitals Group     BP 10/30/18 1457 (!) 150/91     Pulse Rate 10/30/18 1457 93     Resp 10/30/18 1457 20     Temp 10/30/18 1457 98.1 F (36.7 C)     Temp Source 10/30/18 1457 Oral     SpO2 10/30/18 1457 100 %     Weight --      Height --      Head Circumference --      Peak Flow --      Pain Score 10/30/18 1456 5     Pain Loc --      Pain Edu? --      Excl. in [place]? --    No data found.  Updated Vital Signs BP (!) 150/91 (BP Location: Right Arm)   Pulse 93   Temp 98.1 F (36.7 C) (Oral)   Resp 20   SpO2 100%    Physical Exam  Constitutional: She is oriented to person, place, and time. She appears well-developed and well-nourished.  HENT:  Right Ear: External ear normal.  Left Ear: External ear normal.  Eyes:  Bilateral injected conjunctiva  Neck: Normal range of motion. Neck supple.  Pulmonary/Chest: Effort normal. She has wheezes.  Musculoskeletal: Normal range of motion.  Neurological: She is alert and oriented to person, place, and time.  Skin: Skin is warm and dry.  Nursing note and vitals reviewed.    UC Treatments / Results  Labs (all labs ordered are listed, but only abnormal results are displayed) Labs Reviewed - No data to display  EKG None  Radiology No results found.  Procedures Procedures (including critical care time)  Medications Ordered in UC Medications - No data to display  Initial Impression / Assessment and Plan / UC Course  I have reviewed the triage vital signs and the nursing notes.  Pertinent labs & imaging results that were available during my care of the patient were reviewed by me and considered  in my medical decision making (see chart for details).    Final Clinical Impressions(s) / UC Diagnoses   Final diagnoses:  Viral upper respiratory tract infection  Wheezing  Blepharoconjunctivitis of both eyes, unspecified blepharoconjunctivitis type   Discharge Instructions   None    ED Prescriptions    Medication Sig Dispense Auth. Provider   erythromycin ophthalmic ointment Place a 1/2 inch ribbon  of ointment into the lower eyelid 4 times a day 3.5 g Robyn Haber, MD   albuterol (PROVENTIL HFA;VENTOLIN HFA) 108 (90 Base) MCG/ACT inhaler Inhale 1 puff into the lungs every 6 (six) hours as needed for wheezing or shortness of breath. 1 Inhaler Robyn Haber, MD   amoxicillin (AMOXIL) 875 MG tablet Take 1 tablet (875 mg total) by mouth 2 (two) times daily. 20 tablet Robyn Haber, MD   benzonatate (TESSALON) 100 MG capsule Take 1-2 capsules (100-200 mg total) by mouth 3 (three) times daily as needed for cough. 40 capsule Robyn Haber, MD     Controlled Substance Prescriptions  Controlled Substance Registry consulted? Not Applicable   Robyn Haber, MD 10/30/18 1525

## 2018-10-30 NOTE — ED Triage Notes (Signed)
Pt presents with cold symptoms; congestion, nasal drainage, and persistent cough.

## 2018-11-06 ENCOUNTER — Encounter (HOSPITAL_COMMUNITY): Payer: Self-pay

## 2018-11-06 ENCOUNTER — Ambulatory Visit (HOSPITAL_COMMUNITY)
Admission: EM | Admit: 2018-11-06 | Discharge: 2018-11-06 | Disposition: A | Discharge status: Home / Self Care | Payer: Medicare HMO | Attending: Emergency Medicine | Admitting: Emergency Medicine

## 2018-11-06 DIAGNOSIS — B9789 Other viral agents as the cause of diseases classified elsewhere: Secondary | ICD-10-CM | POA: Diagnosis not present

## 2018-11-06 DIAGNOSIS — J4541 Moderate persistent asthma with (acute) exacerbation: Secondary | ICD-10-CM | POA: Diagnosis not present

## 2018-11-06 DIAGNOSIS — R05 Cough: Secondary | ICD-10-CM

## 2018-11-06 DIAGNOSIS — J45901 Unspecified asthma with (acute) exacerbation: Secondary | ICD-10-CM

## 2018-11-06 DIAGNOSIS — J069 Acute upper respiratory infection, unspecified: Secondary | ICD-10-CM

## 2018-11-06 DIAGNOSIS — R69 Illness, unspecified: Secondary | ICD-10-CM | POA: Diagnosis not present

## 2018-11-06 MED ORDER — IBUPROFEN 600 MG PO TABS: 600.0000 mg | ORAL_TABLET | Freq: Four times a day (QID) | ORAL | 0 refills | Status: DC | PRN

## 2018-11-06 MED ORDER — ALBUTEROL SULFATE (2.5 MG/3ML) 0.083% IN NEBU: 2.5000 mg | INHALATION_SOLUTION | Freq: Four times a day (QID) | RESPIRATORY_TRACT | 0 refills | Status: AC | PRN

## 2018-11-06 MED ORDER — AEROCHAMBER PLUS MISC: 2 refills | Status: DC

## 2018-11-06 MED ORDER — DEXAMETHASONE 4 MG PO TABS: ORAL_TABLET | ORAL | 0 refills | Status: DC

## 2018-11-06 NOTE — ED Notes (Signed)
Patient able to ambulate independently  

## 2018-11-06 NOTE — ED Provider Notes (Signed)
HPI  SUBJECTIVE:  Christine Porter is a 67 y.o. female who presents with persistent nasal congestion, postnasal drip, cough, headache for the past 9 days. Patient was seen here on 12/4 for  this, thought to have a viral upper respiratory infection.  she was sent home with albuterol, Tessalon, amoxicillin 10 days.  States that she has not yet finished the amoxicillin.  No documented fevers, sinus pain or pressure, upper dental pain.  She states that her cough is productive of thick, beige mucus.  She reports wheezing, chest soreness secondary to the cough and chest tightness.  States that she is unable to sleep secondary to the cough.  No shortness of breath, dyspnea on exertion.  She states overall she is getting better, but states the cough is still bad at night.  She has been using her albuterol inhaler and nebulizer 3 times a day with improvement in her symptoms.  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  She was not given any steroids last time and wonders if they may be helpful.  She states that her symptoms are better with albuterol, amoxicillin, Tessalon, and going out to the cold air.  States that her cough is worse at night.  She has a past medical history of asthma.  No recent steroid use, or admissions.  She is not a smoker.  She has a history of hypertension, no history of diabetes.  PMD: [facility], [facility]   Past Medical History:  Diagnosis Date  . Asthma   . Gout   . Hypertension   . Pneumonia     Past Surgical History:  Procedure Laterality Date  . CESAREAN SECTION      History reviewed. No pertinent family history.  Social History   Tobacco Use  . Smoking status: Never Smoker  . Smokeless tobacco: Never Used  Substance Use Topics  . Alcohol use: No  . Drug use: No    No current facility-administered medications for this encounter.   Current Outpatient Medications:  .  albuterol (PROVENTIL HFA;VENTOLIN HFA) 108 (90  Base) MCG/ACT inhaler, Inhale 1 puff into the lungs every 6 (six) hours as needed for wheezing or shortness of breath., Disp: 1 Inhaler, Rfl: 3 .  albuterol (PROVENTIL) (2.5 MG/3ML) 0.083% nebulizer solution, Take 3 mLs (2.5 mg total) by nebulization every 6 (six) hours as needed for wheezing or shortness of breath., Disp: 75 mL, Rfl: 0 .  amoxicillin (AMOXIL) 875 MG tablet, Take 1 tablet (875 mg total) by mouth 2 (two) times daily., Disp: 20 tablet, Rfl: 0 .  aspirin EC 325 MG tablet, Take 325 mg by mouth daily., Disp: , Rfl:  .  benzonatate (TESSALON) 100 MG capsule, Take 1-2 capsules (100-200 mg total) by mouth 3 (three) times daily as needed for cough., Disp: 40 capsule, Rfl: 0 .  colchicine 0.6 MG tablet, Take 1 tablet (0.6 mg total) by mouth daily., Disp: 10 tablet, Rfl: 0 .  dexamethasone (DECADRON) 4 MG tablet, 4 tablets (16 mg) po at once on day 1 and 4 tabs (16 mg) po at once on day 2, Disp: 8 tablet, Rfl: 0 .  diclofenac sodium (VOLTAREN) 1 % GEL, Apply 2 g topically 4 (four) times daily., Disp: 1 Tube, Rfl: 1 .  erythromycin ophthalmic ointment, Place a 1/2 inch ribbon of ointment into the lower eyelid 4 times a day, Disp: 3.5 g, Rfl: 0 .  furosemide (LASIX) 40 MG tablet, Take  40 mg by mouth daily as needed for fluid. , Disp: , Rfl:  .  hydrochlorothiazide (HYDRODIURIL) 25 MG tablet, Take 1 tablet (25 mg total) by mouth daily., Disp: 30 tablet, Rfl: 2 .  hydrochlorothiazide (HYDRODIURIL) 25 MG tablet, Take 1 tablet (25 mg total) by mouth daily., Disp: 30 tablet, Rfl: 0 .  ibuprofen (ADVIL,MOTRIN) 600 MG tablet, Take 1 tablet (600 mg total) by mouth every 6 (six) hours as needed., Disp: 30 tablet, Rfl: 0 .  metoprolol (LOPRESSOR) 100 MG tablet, Take 100 mg by mouth 2 (two) times daily., Disp: , Rfl:  .  metoprolol (LOPRESSOR) 100 MG tablet, Take 1 tablet (100 mg total) by mouth 2 (two) times daily., Disp: 60 tablet, Rfl: 2 .  metoprolol tartrate (LOPRESSOR) 100 MG tablet, Take 1 tablet (100  mg total) by mouth once., Disp: 30 tablet, Rfl: 0 .  potassium chloride 20 MEQ TBCR, One a day, Disp: 30 tablet, Rfl: 0 .  sertraline (ZOLOFT) 50 MG tablet, Take 50 mg by mouth daily., Disp: , Rfl:  .  Spacer/Aero-Holding Chambers (AEROCHAMBER PLUS) inhaler, Use as instructed, Disp: 1 each, Rfl: 2 .  traZODone (DESYREL) 100 MG tablet, Take 1 tablet (100 mg total) by mouth at bedtime., Disp: 30 tablet, Rfl: 5 .  triamcinolone cream (KENALOG) 0.1 %, Apply 1 application topically 2 (two) times daily., Disp: 30 g, Rfl: 0 .  UNKNOWN TO PATIENT, Another HTN med, Disp: , Rfl:   Allergies  Allergen Reactions  . Codeine Nausea And Vomiting  . Erythromycin Base      ROS  As noted in HPI.   Physical Exam  BP (!) 155/99 (BP Location: Right Arm)   Pulse 94   Temp 98.1 F (36.7 C) (Oral)   Resp 18   SpO2 99%   Constitutional: Well developed, well nourished, no acute distress Eyes:  EOMI, conjunctiva normal bilaterally HENT: Normocephalic, atraumatic,mucus membranes moist.  Positive Mild nasal congestion.  No sinus tenderness.  Positive postnasal drip. Respiratory: Normal inspiratory effort, poor air movement, diffuse expiratory wheezing, prolonged expiratory phase.  No chest wall tenderness. Cardiovascular: Normal rate, no murmurs, rubs, gallops GI: nondistended skin: No rash, skin intact Musculoskeletal: no deformities Neurologic: Alert & oriented x 3, no focal neuro deficits Psychiatric: Speech and behavior appropriate   ED Course   Medications - No data to display  Orders Placed This Encounter  Procedures  . Peak flow    Done already by MD    Standing Status:   Standing    Number of Occurrences:   1    Order Specific Question:   Pre and post Neb    Answer:   No    No results found for this or any previous visit (from the past 24 hour(s)). No results found.  ED Clinical Impression  Viral URI with cough  Moderate asthma with exacerbation, unspecified whether  persistent   ED Assessment/Plan  Previous records reviewed.  As noted in HPI. Calculated peak flow: 373. Peak flow: 180/200/130.  Patient declined DuoNeb here.  States that she will do it at home.  Patient with a URI/asthma exacerbation.  States that she is feeling better, but is bothered by the persistent cough I suspect is from the asthma exacerbation.  Doubt pneumonia, so chest x-ray was deferred today.  We will have her continue the amoxicillin, do regular albuterol every 4-6 hours for the next 3 days and then back off on it and use as needed.  We will give her  16 mg of dexamethasone for 2 days for an asthma exacerbation.  May continue the cough medicines.  Follow-up with primary care physician if not getting better, to the ER if she gets worse.  Discussed MDM, treatment plan, and plan for follow-up with patient. Discussed sn/sx that should prompt return to the ED. patient agrees with plan.   Meds ordered this encounter  Medications  . albuterol (PROVENTIL) (2.5 MG/3ML) 0.083% nebulizer solution    Sig: Take 3 mLs (2.5 mg total) by nebulization every 6 (six) hours as needed for wheezing or shortness of breath.    Dispense:  75 mL    Refill:  0  . dexamethasone (DECADRON) 4 MG tablet    Sig: 4 tablets (16 mg) po at once on day 1 and 4 tabs (16 mg) po at once on day 2    Dispense:  8 tablet    Refill:  0  . Spacer/Aero-Holding Chambers (AEROCHAMBER PLUS) inhaler    Sig: Use as instructed    Dispense:  1 each    Refill:  2  . ibuprofen (ADVIL,MOTRIN) 600 MG tablet    Sig: Take 1 tablet (600 mg total) by mouth every 6 (six) hours as needed.    Dispense:  30 tablet    Refill:  0    *This clinic note was created using [employer]. Therefore, there may be occasional mistakes despite careful proofreading.   ?   Melynda Ripple, MD 11/07/18 1252

## 2018-11-06 NOTE — ED Triage Notes (Addendum)
Pt returns with same symptoms, Congestion, nasal drainage and persistent cough. Pt states she has not completed the antibiotics she was prescribed.

## 2018-11-06 NOTE — Discharge Instructions (Addendum)
Take two puffs from your albuterol inhaler or take a nebulizer treatment every 4-6 hours on a regular basis for the next 3 days, then you may use it as needed.Christine Porter the steroids unless your doctor tells you to stop. Finish the antibiotics, even if you feel better. Do a peak flow, once the morning and once at night. Write this down. The number should be going up, not down. You may decrease the frequency of your albuterol inhaler as the numbers go up and you start feeling better.  You may take tylenol 1 gram up to 4 times a day as needed for pain. This with 600 mg of motrin is an effective combination for pain and fever. Make sure you drink extra fluids. Return to the ER if you get worse, have a fever >100.4, or any other concerns.   Go to www.goodrx.com to look up your medications. This will give you a list of where you can find your prescriptions at the most affordable prices. Or ask the pharmacist what the cash price is, or if they have any other discount programs available to help make your medication more affordable. This can be less expensive than what you would pay with insurance.

## 2018-12-29 ENCOUNTER — Encounter (HOSPITAL_COMMUNITY): Payer: Self-pay | Admitting: Emergency Medicine

## 2018-12-29 ENCOUNTER — Ambulatory Visit (HOSPITAL_COMMUNITY)
Admission: EM | Admit: 2018-12-29 | Discharge: 2018-12-29 | Disposition: A | Discharge status: Home / Self Care | Payer: Medicare HMO | Attending: Family Medicine | Admitting: Family Medicine

## 2018-12-29 DIAGNOSIS — S46811A Strain of other muscles, fascia and tendons at shoulder and upper arm level, right arm, initial encounter: Secondary | ICD-10-CM

## 2018-12-29 MED ORDER — ACETAMINOPHEN 500 MG PO TABS: 500.0000 mg | ORAL_TABLET | Freq: Four times a day (QID) | ORAL | 0 refills | Status: DC | PRN

## 2018-12-29 MED ORDER — TIZANIDINE HCL 2 MG PO CAPS: 2.0000 mg | ORAL_CAPSULE | Freq: Every day | ORAL | 0 refills | Status: DC
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
## 2018-12-29 NOTE — Discharge Instructions (Signed)
Rest, ice and heat as needed Ensure adequate ROM as tolerated. Injuries all appear to be muscular in nature. Prescribed tylenol as needed for inflammation and pain relief Prescribed zanaflex as needed at bedtime for muscle spasm.  Do not drive or operate heavy machinery while taking this medication Expect some increased pain in the next 1-3 days.  It may take 3-4 weeks for complete resolution of symptoms Will f/u with her doctor or here if not seeing significant improvement within one week. Return here or go to ER if you have any new or worsening symptoms such as numbness/tingling of the inner thighs, loss of bladder or bowel control, headache/blurry vision, nausea/vomiting, confusion/altered mental status, dizziness, weakness, passing out, imbalance, etc..Marland Kitchen

## 2019-01-03 DIAGNOSIS — H44002 Unspecified purulent endophthalmitis, left eye: Secondary | ICD-10-CM | POA: Diagnosis not present

## 2019-01-03 DIAGNOSIS — I10 Essential (primary) hypertension: Secondary | ICD-10-CM | POA: Diagnosis not present

## 2019-01-03 DIAGNOSIS — Z713 Dietary counseling and surveillance: Secondary | ICD-10-CM | POA: Diagnosis not present

## 2019-01-03 DIAGNOSIS — E669 Obesity, unspecified: Secondary | ICD-10-CM | POA: Diagnosis not present

## 2019-01-03 DIAGNOSIS — Z9114 Patient's other noncompliance with medication regimen: Secondary | ICD-10-CM | POA: Diagnosis not present

## 2019-01-06 DIAGNOSIS — E785 Hyperlipidemia, unspecified: Secondary | ICD-10-CM | POA: Diagnosis not present

## 2019-01-06 DIAGNOSIS — E119 Type 2 diabetes mellitus without complications: Secondary | ICD-10-CM | POA: Diagnosis not present

## 2019-01-06 DIAGNOSIS — M109 Gout, unspecified: Secondary | ICD-10-CM | POA: Diagnosis not present

## 2019-01-06 DIAGNOSIS — E559 Vitamin D deficiency, unspecified: Secondary | ICD-10-CM | POA: Diagnosis not present

## 2019-01-06 DIAGNOSIS — I10 Essential (primary) hypertension: Secondary | ICD-10-CM | POA: Diagnosis not present

## 2019-01-06 DIAGNOSIS — E669 Obesity, unspecified: Secondary | ICD-10-CM | POA: Diagnosis not present

## 2019-01-06 DIAGNOSIS — M2550 Pain in unspecified joint: Secondary | ICD-10-CM | POA: Diagnosis not present

## 2019-01-28 ENCOUNTER — Encounter (HOSPITAL_COMMUNITY): Payer: Self-pay | Admitting: Emergency Medicine

## 2019-01-28 ENCOUNTER — Other Ambulatory Visit: Payer: Self-pay

## 2019-01-28 ENCOUNTER — Ambulatory Visit (HOSPITAL_COMMUNITY)
Admission: EM | Admit: 2019-01-28 | Discharge: 2019-01-28 | Disposition: A | Discharge status: Home / Self Care | Payer: Medicare HMO | Attending: Family Medicine | Admitting: Family Medicine

## 2019-01-28 DIAGNOSIS — M10471 Other secondary gout, right ankle and foot: Secondary | ICD-10-CM

## 2019-01-28 DIAGNOSIS — M79671 Pain in right foot: Secondary | ICD-10-CM

## 2019-01-28 DIAGNOSIS — M10071 Idiopathic gout, right ankle and foot: Secondary | ICD-10-CM

## 2019-01-28 MED ORDER — METHYLPREDNISOLONE ACETATE 80 MG/ML IJ SUSP
INTRAMUSCULAR | Status: AC
  Filled 2019-01-28: qty 1

## 2019-01-28 MED ORDER — HYDROCODONE-ACETAMINOPHEN 5-325 MG PO TABS
1.0000 | ORAL_TABLET | Freq: Once | ORAL | Status: AC
  Administered 2019-01-28: 1 via ORAL

## 2019-01-28 MED ORDER — METHYLPREDNISOLONE ACETATE 80 MG/ML IJ SUSP
80.0000 mg | Freq: Once | INTRAMUSCULAR | Status: AC
  Administered 2019-01-28: 80 mg via INTRAMUSCULAR

## 2019-01-28 MED ORDER — HYDROCODONE-ACETAMINOPHEN 5-325 MG PO TABS
ORAL_TABLET | ORAL | Status: AC
  Filled 2019-01-28: qty 1

## 2019-01-28 NOTE — ED Triage Notes (Signed)
Pt reports right foot pain related to gout that has been going on for two days.  She reports her Allopurinol is not helping.  She also tried Ibuprofen.

## 2019-01-28 NOTE — ED Provider Notes (Signed)
[place]    CSN: [id number] Arrival date & time: 01/28/19  1654     History   Chief Complaint Chief Complaint  Patient presents with  . Foot Pain    right    HPI Christine Porter is a 68 y.o. female.   HPI  Patient states she is having a gout attack on her left foot.  She tried ibuprofen.  This did not help.  She is on allopurinol.  She admits to dietary indiscretion. She states she otherwise is in good health.  She is good with her medications and regular medical care.  Up-to-date with her preventative medicine and immunizations.  Past Medical History:  Diagnosis Date  . Asthma   . Gout   . Hypertension   . Pneumonia     There are no active problems to display for this patient.   Past Surgical History:  Procedure Laterality Date  . CESAREAN SECTION      OB History   No obstetric history on file.      Home Medications    Prior to Admission medications   Medication Sig Start Date End Date Taking? Authorizing Provider  acetaminophen (TYLENOL) 500 MG tablet Take 1 tablet (500 mg total) by mouth every 6 (six) hours as needed. 12/29/18  Yes Wurst, [place], PA-C  albuterol (PROVENTIL HFA;VENTOLIN HFA) 108 (90 Base) MCG/ACT inhaler Inhale 1 puff into the lungs every 6 (six) hours as needed for wheezing or shortness of breath. 10/30/18  Yes Robyn Haber, MD  albuterol (PROVENTIL) (2.5 MG/3ML) 0.083% nebulizer solution Take 3 mLs (2.5 mg total) by nebulization every 6 (six) hours as needed for wheezing or shortness of breath. 11/06/18  Yes Melynda Ripple, MD  allopurinol (ZYLOPRIM) 100 MG tablet TAKE 1 TABLET BY MOUTH ONCE DAILY FOR 30 DAYS 10/30/18  Yes [provider]  amLODIPine Besylate (NORVASC PO) Take by mouth.   Yes [provider]  aspirin EC 325 MG tablet Take 325 mg by mouth daily.   Yes [provider]  diclofenac sodium (VOLTAREN) 1 % GEL Apply 2 g topically 4 (four) times daily. 03/19/16  Yes Kindl, Nelda Severe, MD    ibuprofen (ADVIL,MOTRIN) 600 MG tablet Take 1 tablet (600 mg total) by mouth every 6 (six) hours as needed. 11/06/18  Yes Melynda Ripple, MD  potassium chloride 20 MEQ TBCR One a day 04/03/15  Yes Fransico Meadow, PA-C  Spacer/Aero-Holding Chambers (AEROCHAMBER PLUS) inhaler Use as instructed 11/06/18  Yes Melynda Ripple, MD  traZODone (DESYREL) 100 MG tablet Take 1 tablet (100 mg total) by mouth at bedtime. 07/27/12  Yes Roselee Culver, MD  benzonatate (TESSALON) 100 MG capsule Take 1-2 capsules (100-200 mg total) by mouth 3 (three) times daily as needed for cough. 10/30/18   Robyn Haber, MD  erythromycin ophthalmic ointment Place a 1/2 inch ribbon of ointment into the lower eyelid 4 times a day 10/30/18   Robyn Haber, MD  furosemide (LASIX) 40 MG tablet Take 40 mg by mouth daily as needed for fluid.     [provider]  hydrochlorothiazide (HYDRODIURIL) 25 MG tablet Take 1 tablet (25 mg total) by mouth daily. 05/22/15   Virgel Manifold, MD  hydrochlorothiazide (HYDRODIURIL) 25 MG tablet Take 1 tablet (25 mg total) by mouth daily. 08/22/15   Cartner, Marland Kitchen, PA-C  metoprolol (LOPRESSOR) 100 MG tablet Take 100 mg by mouth 2 (two) times daily.    [provider]  sertraline (ZOLOFT) 50 MG tablet Take 50  mg by mouth daily.    [provider]  tizanidine (ZANAFLEX) 2 MG capsule Take 1 capsule (2 mg total) by mouth at bedtime. 12/29/18   Wurst, [place], PA-C  triamcinolone cream (KENALOG) 0.1 % Apply 1 application topically 2 (two) times daily. 08/17/18   Wieters, Hallie C, PA-C  UNKNOWN TO PATIENT Another HTN med    [provider]    Family History History reviewed. No pertinent family history.  Social History Social History   Tobacco Use  . Smoking status: Never Smoker  . Smokeless tobacco: Never Used  Substance Use Topics  . Alcohol use: No  . Drug use: No     Allergies   Codeine and Erythromycin base   Review of Systems Review of  Systems  Constitutional: Negative for chills and fever.  HENT: Negative for ear pain and sore throat.   Eyes: Negative for pain and visual disturbance.  Respiratory: Negative for cough and shortness of breath.   Cardiovascular: Negative for chest pain and palpitations.  Gastrointestinal: Negative for abdominal pain and vomiting.  Genitourinary: Negative for dysuria and hematuria.  Musculoskeletal: Positive for arthralgias and gait problem. Negative for back pain.  Skin: Negative for color change and rash.  Neurological: Negative for seizures and syncope.  All other systems reviewed and are negative.    Physical Exam Triage Vital Signs ED Triage Vitals  Enc Vitals Group     BP 01/28/19 1733 136/84     Pulse Rate 01/28/19 1733 82     Resp --      Temp 01/28/19 1733 98.3 F (36.8 C)     Temp Source 01/28/19 1733 Oral     SpO2 01/28/19 1733 99 %     Weight --      Height --      Head Circumference --      Peak Flow --      Pain Score 01/28/19 1730 8     Pain Loc --      Pain Edu? --      Excl. in [place]? --    No data found.  Updated Vital Signs BP 136/84 (BP Location: Right Wrist)   Pulse 82   Temp 98.3 F (36.8 C) (Oral)   SpO2 99%   Visual Acuity Right Eye Distance:   Left Eye Distance:   Bilateral Distance:    Right Eye Near:   Left Eye Near:    Bilateral Near:     Physical Exam Constitutional:      General: She is not in acute distress.    Appearance: She is well-developed.  HENT:     Head: Normocephalic and atraumatic.  Eyes:     Conjunctiva/sclera: Conjunctivae normal.     Pupils: Pupils are equal, round, and reactive to light.  Neck:     Musculoskeletal: Normal range of motion.  Cardiovascular:     Rate and Rhythm: Normal rate.  Pulmonary:     Effort: Pulmonary effort is normal. No respiratory distress.  Abdominal:     General: There is no distension.     Palpations: Abdomen is soft.  Musculoskeletal: Normal range of motion.     Comments: Right  toe right foot has redness, tenderness, synovitis around the first MTP.  Pain with toe movement.  Distal neurovascular is intact.  Skin:    General: Skin is warm and dry.  Neurological:     Mental Status: She is alert.      UC Treatments / Results  Labs (  all labs ordered are listed, but only abnormal results are displayed) Labs Reviewed - No data to display  EKG None  Radiology No results found.  Procedures Procedures (including critical care time)  Medications Ordered in UC Medications  HYDROcodone-acetaminophen (NORCO/VICODIN) 5-325 MG per tablet 1 tablet (1 tablet Oral Given 01/28/19 1835)  methylPREDNISolone acetate (DEPO-MEDROL) injection 80 mg (80 mg Intramuscular Given 01/28/19 1835)    Initial Impression / Assessment and Plan / UC Course  I have reviewed the triage vital signs and the nursing notes.  Pertinent labs & imaging results that were available during my care of the patient were reviewed by me and considered in my medical decision making (see chart for details).     Gout flare. Final Clinical Impressions(s) / UC Diagnoses   Final diagnoses:  None     Discharge Instructions     Need to see your PCP to discuss the gout prevention The steroid shot should last a few days to heal the foot I hope you feel better   ED Prescriptions    None     Controlled Substance Prescriptions [place] Controlled Substance Registry consulted? Not Applicable   Raylene Everts, MD 01/28/19 2124

## 2019-01-28 NOTE — Discharge Instructions (Addendum)
Need to see your PCP to discuss the gout prevention The steroid shot should last a few days to heal the foot I hope you feel better

## 2019-01-29 DIAGNOSIS — H5213 Myopia, bilateral: Secondary | ICD-10-CM | POA: Diagnosis not present

## 2019-01-29 DIAGNOSIS — H35383 Toxic maculopathy, bilateral: Secondary | ICD-10-CM | POA: Diagnosis not present

## 2019-01-29 DIAGNOSIS — H52223 Regular astigmatism, bilateral: Secondary | ICD-10-CM | POA: Diagnosis not present

## 2019-02-04 DIAGNOSIS — H01015 Ulcerative blepharitis left lower eyelid: Secondary | ICD-10-CM | POA: Diagnosis not present

## 2019-02-04 DIAGNOSIS — H2513 Age-related nuclear cataract, bilateral: Secondary | ICD-10-CM | POA: Diagnosis not present

## 2019-02-04 DIAGNOSIS — H01012 Ulcerative blepharitis right lower eyelid: Secondary | ICD-10-CM | POA: Diagnosis not present

## 2019-02-04 DIAGNOSIS — H3553 Other dystrophies primarily involving the sensory retina: Secondary | ICD-10-CM | POA: Diagnosis not present

## 2019-04-29 DIAGNOSIS — M109 Gout, unspecified: Secondary | ICD-10-CM | POA: Diagnosis not present

## 2019-04-29 DIAGNOSIS — E559 Vitamin D deficiency, unspecified: Secondary | ICD-10-CM | POA: Diagnosis not present

## 2019-04-29 DIAGNOSIS — J45909 Unspecified asthma, uncomplicated: Secondary | ICD-10-CM | POA: Diagnosis not present

## 2019-04-29 DIAGNOSIS — I10 Essential (primary) hypertension: Secondary | ICD-10-CM | POA: Diagnosis not present

## 2019-05-23 DIAGNOSIS — G629 Polyneuropathy, unspecified: Secondary | ICD-10-CM | POA: Diagnosis not present

## 2019-05-23 DIAGNOSIS — G4700 Insomnia, unspecified: Secondary | ICD-10-CM | POA: Diagnosis not present

## 2019-05-23 DIAGNOSIS — I10 Essential (primary) hypertension: Secondary | ICD-10-CM | POA: Diagnosis not present

## 2019-05-23 DIAGNOSIS — F419 Anxiety disorder, unspecified: Secondary | ICD-10-CM | POA: Diagnosis not present

## 2019-05-23 DIAGNOSIS — J45909 Unspecified asthma, uncomplicated: Secondary | ICD-10-CM | POA: Diagnosis not present

## 2019-05-23 DIAGNOSIS — R69 Illness, unspecified: Secondary | ICD-10-CM | POA: Diagnosis not present

## 2019-05-23 DIAGNOSIS — M109 Gout, unspecified: Secondary | ICD-10-CM | POA: Diagnosis not present

## 2019-05-23 DIAGNOSIS — Z7982 Long term (current) use of aspirin: Secondary | ICD-10-CM | POA: Diagnosis not present

## 2019-05-23 DIAGNOSIS — Z803 Family history of malignant neoplasm of breast: Secondary | ICD-10-CM | POA: Diagnosis not present

## 2019-09-08 ENCOUNTER — Ambulatory Visit (HOSPITAL_COMMUNITY)
Admission: EM | Admit: 2019-09-08 | Discharge: 2019-09-08 | Disposition: A | Discharge status: Home / Self Care | Payer: Medicare HMO | Attending: Family Medicine | Admitting: Family Medicine

## 2019-09-08 ENCOUNTER — Encounter (HOSPITAL_COMMUNITY): Payer: Self-pay

## 2019-09-08 ENCOUNTER — Other Ambulatory Visit: Payer: Self-pay

## 2019-09-08 DIAGNOSIS — R519 Headache, unspecified: Secondary | ICD-10-CM | POA: Diagnosis not present

## 2019-09-08 DIAGNOSIS — T59811A Toxic effect of smoke, accidental (unintentional), initial encounter: Secondary | ICD-10-CM
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
## 2019-09-10 ENCOUNTER — Other Ambulatory Visit: Payer: Self-pay

## 2019-09-10 ENCOUNTER — Emergency Department (HOSPITAL_COMMUNITY)
Admission: EM | Admit: 2019-09-10 | Discharge: 2019-09-10 | Disposition: A | Discharge status: Home / Self Care | Payer: Medicare HMO | Attending: Emergency Medicine | Admitting: Emergency Medicine

## 2019-09-10 DIAGNOSIS — Z79899 Other long term (current) drug therapy: Secondary | ICD-10-CM | POA: Diagnosis not present

## 2019-09-10 DIAGNOSIS — Z7729 Contact with and (suspected ) exposure to other hazardous substances: Secondary | ICD-10-CM | POA: Insufficient documentation

## 2019-09-10 DIAGNOSIS — J45909 Unspecified asthma, uncomplicated: Secondary | ICD-10-CM | POA: Insufficient documentation

## 2019-09-10 DIAGNOSIS — R519 Headache, unspecified: Secondary | ICD-10-CM | POA: Diagnosis not present

## 2019-09-10 DIAGNOSIS — I10 Essential (primary) hypertension: Secondary | ICD-10-CM | POA: Diagnosis not present

## 2019-09-10 DIAGNOSIS — Z7722 Contact with and (suspected) exposure to environmental tobacco smoke (acute) (chronic): Secondary | ICD-10-CM | POA: Diagnosis not present

## 2019-09-10 DIAGNOSIS — Z7982 Long term (current) use of aspirin: Secondary | ICD-10-CM | POA: Diagnosis not present

## 2019-09-10 LAB — BASIC METABOLIC PANEL
Anion gap: 12 (ref 5–15)
BUN: 10 mg/dL (ref 8–23)
CO2: 27 mmol/L (ref 22–32)
Calcium: 8.8 mg/dL — ABNORMAL LOW (ref 8.9–10.3)
Chloride: 101 mmol/L (ref 98–111)
Creatinine, Ser: 0.71 mg/dL (ref 0.44–1.00)
GFR calc Af Amer: >60 mL/min (ref >60)
GFR calc non Af Amer: >60 mL/min (ref >60)
Glucose, Bld: 172 mg/dL — ABNORMAL HIGH (ref 70–99)
Potassium: 3 mmol/L — ABNORMAL LOW (ref 3.5–5.1)
Sodium: 140 mmol/L (ref 135–145)

## 2019-09-10 LAB — CBC WITH DIFFERENTIAL/PLATELET
Abs Immature Granulocytes: 0.01 10*3/uL (ref 0.00–0.07)
Basophils Absolute: 0 10*3/uL (ref 0.0–0.1)
Basophils Relative: 1 %
Eosinophils Absolute: 0.1 10*3/uL (ref 0.0–0.5)
Eosinophils Relative: 2 %
HCT: 33.1 % — ABNORMAL LOW (ref 36.0–46.0)
Hemoglobin: 10.6 g/dL — ABNORMAL LOW (ref 12.0–15.0)
Immature Granulocytes: 0 %
Lymphocytes Relative: 37 %
Lymphs Abs: 2.2 10*3/uL (ref 0.7–4.0)
MCH: 24.8 pg — ABNORMAL LOW (ref 26.0–34.0)
MCHC: 32 g/dL (ref 30.0–36.0)
MCV: 77.5 fL — ABNORMAL LOW (ref 80.0–100.0)
Monocytes Absolute: 0.6 10*3/uL (ref 0.1–1.0)
Monocytes Relative: 9 %
Neutro Abs: 3.1 10*3/uL (ref 1.7–7.7)
Neutrophils Relative %: 51 %
Platelets: 316 10*3/uL (ref 150–400)
RBC: 4.27 MIL/uL (ref 3.87–5.11)
RDW: 16.2 % — ABNORMAL HIGH (ref 11.5–15.5)
WBC: 6.1 10*3/uL (ref 4.0–10.5)
nRBC: 0 % (ref 0.0–0.2)

## 2019-09-10 LAB — CARBOXYHEMOGLOBIN - COOX: Carboxyhemoglobin: 3 % — ABNORMAL HIGH (ref 0.5–1.5)

## 2019-09-10 MED ORDER — TRAMADOL HCL 50 MG PO TABS: 50.0000 mg | ORAL_TABLET | Freq: Four times a day (QID) | ORAL | 0 refills | Status: DC | PRN
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
## 2019-09-10 NOTE — Discharge Instructions (Addendum)
Begin taking tramadol as prescribed as needed for headache.  Follow-up with your primary doctor if symptoms or not improving in the next few days, and return to the ER if symptoms significantly worsen or change.
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
## 2019-10-30 DIAGNOSIS — J45909 Unspecified asthma, uncomplicated: Secondary | ICD-10-CM | POA: Diagnosis not present

## 2019-10-30 DIAGNOSIS — E559 Vitamin D deficiency, unspecified: Secondary | ICD-10-CM | POA: Diagnosis not present

## 2019-10-30 DIAGNOSIS — I10 Essential (primary) hypertension: Secondary | ICD-10-CM | POA: Diagnosis not present

## 2019-10-30 DIAGNOSIS — R69 Illness, unspecified: Secondary | ICD-10-CM | POA: Diagnosis not present

## 2019-11-03 DIAGNOSIS — I10 Essential (primary) hypertension: Secondary | ICD-10-CM | POA: Diagnosis not present

## 2019-11-03 DIAGNOSIS — E559 Vitamin D deficiency, unspecified: Secondary | ICD-10-CM | POA: Diagnosis not present

## 2019-11-22 DIAGNOSIS — R69 Illness, unspecified: Secondary | ICD-10-CM | POA: Diagnosis not present

## 2019-12-03 DIAGNOSIS — M25539 Pain in unspecified wrist: Secondary | ICD-10-CM | POA: Diagnosis not present

## 2020-01-14 DIAGNOSIS — G5610 Other lesions of median nerve, unspecified upper limb: Secondary | ICD-10-CM | POA: Diagnosis not present

## 2020-01-14 DIAGNOSIS — I10 Essential (primary) hypertension: Secondary | ICD-10-CM | POA: Diagnosis not present

## 2020-01-14 DIAGNOSIS — Z131 Encounter for screening for diabetes mellitus: Secondary | ICD-10-CM | POA: Diagnosis not present

## 2020-01-14 DIAGNOSIS — Z0000 Encounter for general adult medical examination without abnormal findings: Secondary | ICD-10-CM | POA: Diagnosis not present

## 2020-01-14 DIAGNOSIS — R6889 Other general symptoms and signs: Secondary | ICD-10-CM | POA: Diagnosis not present

## 2020-01-14 DIAGNOSIS — J4520 Mild intermittent asthma, uncomplicated: Secondary | ICD-10-CM | POA: Diagnosis not present

## 2020-01-14 DIAGNOSIS — Z13220 Encounter for screening for lipoid disorders: Secondary | ICD-10-CM | POA: Diagnosis not present

## 2020-01-14 DIAGNOSIS — J302 Other seasonal allergic rhinitis: Secondary | ICD-10-CM | POA: Diagnosis not present

## 2020-01-14 DIAGNOSIS — M4712 Other spondylosis with myelopathy, cervical region: Secondary | ICD-10-CM | POA: Diagnosis not present

## 2020-01-14 DIAGNOSIS — Z79899 Other long term (current) drug therapy: Secondary | ICD-10-CM | POA: Diagnosis not present

## 2020-01-14 DIAGNOSIS — M1000 Idiopathic gout, unspecified site: Secondary | ICD-10-CM | POA: Diagnosis not present

## 2020-02-18 DIAGNOSIS — M0689 Other specified rheumatoid arthritis, multiple sites: Secondary | ICD-10-CM | POA: Diagnosis not present

## 2020-02-18 DIAGNOSIS — J4520 Mild intermittent asthma, uncomplicated: Secondary | ICD-10-CM | POA: Diagnosis not present

## 2020-02-18 DIAGNOSIS — I10 Essential (primary) hypertension: Secondary | ICD-10-CM | POA: Diagnosis not present

## 2020-02-18 DIAGNOSIS — K5900 Constipation, unspecified: Secondary | ICD-10-CM | POA: Diagnosis not present

## 2020-02-18 DIAGNOSIS — M1000 Idiopathic gout, unspecified site: Secondary | ICD-10-CM | POA: Diagnosis not present

## 2020-02-25 DIAGNOSIS — I10 Essential (primary) hypertension: Secondary | ICD-10-CM | POA: Diagnosis not present

## 2020-02-25 DIAGNOSIS — J4520 Mild intermittent asthma, uncomplicated: Secondary | ICD-10-CM | POA: Diagnosis not present

## 2020-02-25 DIAGNOSIS — M1000 Idiopathic gout, unspecified site: Secondary | ICD-10-CM | POA: Diagnosis not present

## 2020-02-25 DIAGNOSIS — M0689 Other specified rheumatoid arthritis, multiple sites: Secondary | ICD-10-CM | POA: Diagnosis not present

## 2020-03-24 DIAGNOSIS — G4700 Insomnia, unspecified: Secondary | ICD-10-CM | POA: Diagnosis not present

## 2020-03-24 DIAGNOSIS — I10 Essential (primary) hypertension: Secondary | ICD-10-CM | POA: Diagnosis not present

## 2020-03-24 DIAGNOSIS — J4520 Mild intermittent asthma, uncomplicated: Secondary | ICD-10-CM | POA: Diagnosis not present

## 2020-03-24 DIAGNOSIS — M0689 Other specified rheumatoid arthritis, multiple sites: Secondary | ICD-10-CM | POA: Diagnosis not present

## 2020-04-21 DIAGNOSIS — J4520 Mild intermittent asthma, uncomplicated: Secondary | ICD-10-CM | POA: Diagnosis not present

## 2020-04-21 DIAGNOSIS — E669 Obesity, unspecified: Secondary | ICD-10-CM | POA: Diagnosis not present

## 2020-04-21 DIAGNOSIS — M0689 Other specified rheumatoid arthritis, multiple sites: Secondary | ICD-10-CM | POA: Diagnosis not present

## 2020-04-21 DIAGNOSIS — I10 Essential (primary) hypertension: Secondary | ICD-10-CM | POA: Diagnosis not present

## 2020-04-21 DIAGNOSIS — E1165 Type 2 diabetes mellitus with hyperglycemia: Secondary | ICD-10-CM | POA: Diagnosis not present
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
## 2020-05-11 ENCOUNTER — Ambulatory Visit: Payer: Medicare HMO | Admitting: Rheumatology

## 2020-05-11 ENCOUNTER — Encounter: Payer: Self-pay | Admitting: Rheumatology

## 2020-05-11 ENCOUNTER — Other Ambulatory Visit: Payer: Self-pay

## 2020-05-11 ENCOUNTER — Encounter (INDEPENDENT_AMBULATORY_CARE_PROVIDER_SITE_OTHER): Payer: Self-pay

## 2020-05-11 VITALS — BP 131/87 | HR 84 | Resp 16 | Ht 64.0 in | Wt 217.0 lb

## 2020-05-11 DIAGNOSIS — M109 Gout, unspecified: Secondary | ICD-10-CM

## 2020-05-11 DIAGNOSIS — I10 Essential (primary) hypertension: Secondary | ICD-10-CM | POA: Diagnosis not present

## 2020-05-11 DIAGNOSIS — M4712 Other spondylosis with myelopathy, cervical region: Secondary | ICD-10-CM

## 2020-05-11 DIAGNOSIS — R768 Other specified abnormal immunological findings in serum: Secondary | ICD-10-CM | POA: Diagnosis not present

## 2020-05-11 DIAGNOSIS — Z8709 Personal history of other diseases of the respiratory system: Secondary | ICD-10-CM | POA: Diagnosis not present

## 2020-05-11 DIAGNOSIS — Z8669 Personal history of other diseases of the nervous system and sense organs: Secondary | ICD-10-CM | POA: Diagnosis not present

## 2020-05-24 NOTE — Progress Notes (Deleted)
   Office Visit Note  Patient: Christine Porter             Date of Birth: 1951/03/10           MRN: [id number]             PCP: [facility] Referring: [facility] Visit Date: 06/01/2020 Occupation: @GUAROCC @  Subjective:  No chief complaint on file.   History of Present Illness: Christine Porter is a 69 y.o. female ***   Activities of Daily Living:  Patient reports morning stiffness for *** {minute/hour:19697}.   Patient {ACTIONS;DENIES/REPORTS:21021675::"Denies"} nocturnal pain.  Difficulty dressing/grooming: {ACTIONS;DENIES/REPORTS:21021675::"Denies"} Difficulty climbing stairs: {ACTIONS;DENIES/REPORTS:21021675::"Denies"} Difficulty getting out of chair: {ACTIONS;DENIES/REPORTS:21021675::"Denies"} Difficulty using hands for taps, buttons, cutlery, and/or writing: {ACTIONS;DENIES/REPORTS:21021675::"Denies"}  No Rheumatology ROS completed.   PMFS History:  There are no problems to display for this patient.   Past Medical History:  Diagnosis Date  . Asthma   . Gout   . Hypertension   . Pneumonia     Family History  Problem Relation Age of Onset  . Breast cancer Daughter    Past Surgical History:  Procedure Laterality Date  . CESAREAN SECTION     Social History   Social History Narrative  . Not on file    There is no immunization history on file for this patient.   Objective: Vital Signs: There were no vitals taken for this visit.   Physical Exam   Musculoskeletal Exam: ***  CDAI Exam: CDAI Score: -- Patient Global: --; Provider Global: -- Swollen: --; Tender: -- Joint Exam 06/01/2020   No joint exam has been documented for this visit   There is currently no information documented on the homunculus. Go to the Rheumatology activity and complete the homunculus joint exam.  Investigation: No additional findings.  Imaging: No results found.  Recent Labs: Lab Results  Component Value Date   WBC 6.1 09/10/2019    HGB 10.6 (L) 09/10/2019   PLT 316 09/10/2019   NA 140 09/10/2019   K 3.0 (L) 09/10/2019   CL 101 09/10/2019   CO2 27 09/10/2019   GLUCOSE 172 (H) 09/10/2019   BUN 10 09/10/2019   CREATININE 0.71 09/10/2019   BILITOT 0.7 08/22/2015   ALKPHOS 83 08/22/2015   AST 23 08/22/2015   ALT 7 (L) 08/22/2015   PROT 7.4 08/22/2015   ALBUMIN 3.6 08/22/2015   CALCIUM 8.8 (L) 09/10/2019   GFRAA >60 09/10/2019    Speciality Comments: No specialty comments available.  Procedures:  No procedures performed Allergies: Codeine and Erythromycin base   Assessment / Plan:     Visit Diagnoses: No diagnosis found.  Orders: No orders of the defined types were placed in this encounter.  No orders of the defined types were placed in this encounter.   Face-to-face time spent with patient was *** minutes. Greater than 50% of time was spent in counseling and coordination of care.  Follow-Up Instructions: No follow-ups on file.   Bo Merino, MD  Note - This record has been created using Editor, commissioning.  Chart creation errors have been sought, but may not always  have been located. Such creation errors do not reflect on  the standard of medical care.

## 2020-06-01 ENCOUNTER — Ambulatory Visit: Payer: Medicare HMO | Admitting: Rheumatology

## 2020-06-24 ENCOUNTER — Ambulatory Visit: Payer: Medicare HMO | Admitting: Rheumatology

## 2020-06-24 ENCOUNTER — Encounter: Payer: Self-pay | Admitting: Rheumatology

## 2020-06-24 ENCOUNTER — Other Ambulatory Visit: Payer: Self-pay

## 2020-06-24 VITALS — BP 118/80 | HR 90 | Resp 18 | Ht 64.0 in | Wt 219.0 lb

## 2020-06-24 DIAGNOSIS — R7689 Other specified abnormal immunological findings in serum: Secondary | ICD-10-CM

## 2020-06-24 DIAGNOSIS — M109 Gout, unspecified: Secondary | ICD-10-CM

## 2020-06-24 DIAGNOSIS — M5030 Other cervical disc degeneration, unspecified cervical region: Secondary | ICD-10-CM | POA: Diagnosis not present

## 2020-06-24 DIAGNOSIS — R768 Other specified abnormal immunological findings in serum: Secondary | ICD-10-CM

## 2020-06-24 DIAGNOSIS — Z8669 Personal history of other diseases of the nervous system and sense organs: Secondary | ICD-10-CM | POA: Diagnosis not present

## 2020-06-24 DIAGNOSIS — Z79899 Other long term (current) drug therapy: Secondary | ICD-10-CM | POA: Diagnosis not present

## 2020-06-24 DIAGNOSIS — Z8709 Personal history of other diseases of the respiratory system: Secondary | ICD-10-CM

## 2020-06-24 DIAGNOSIS — M791 Myalgia, unspecified site: Secondary | ICD-10-CM | POA: Diagnosis not present

## 2020-06-24 DIAGNOSIS — I10 Essential (primary) hypertension: Secondary | ICD-10-CM | POA: Diagnosis not present

## 2020-06-24 DIAGNOSIS — R700 Elevated erythrocyte sedimentation rate: Secondary | ICD-10-CM | POA: Diagnosis not present

## 2020-06-24 MED ORDER — PREDNISONE 5 MG PO TABS: ORAL_TABLET | ORAL | 1 refills | Status: DC

## 2020-06-24 NOTE — Patient Instructions (Signed)
You will take prednisone 5 mg, 2 tablets every morning.  Prednisone suppresses your immune system.  Even if you are fully vaccinated you should continue to practice social distancing, hand hygiene and wearing mask.  If you develop COVID-19 infection please contact your primary care doctor or our office to find out if you are eligible for antibody infusion.

## 2020-06-24 NOTE — Progress Notes (Addendum)
Office Visit Note  Patient: Christine Porter             Date of Birth: [birth year]           MRN: [id number]             PCP: [facility] Referring: [facility] Visit Date: 06/24/2020 Occupation: @GUAROCC @  Subjective:  Increasing stiffness.   History of Present Illness: Christine Porter is a 69 y.o. female with history of positive rheumatoid factor.  She was seen last while she was on prednisone.  She had no synovitis on examination.  She states she stopped prednisone after the last visit which was about 6 weeks ago.  She states that she has been experiencing increasing stiffness.  She has difficulty getting out of the chair.  She states most of the discomfort is in her muscles and not in her joints.  She denies any joint swelling.  She has discomfort in her neck especially at nighttime.  She has been experiencing some tingling in her right hand.  Activities of Daily Living:  Patient reports morning stiffness for 24  hours.   Patient Reports nocturnal pain.  Difficulty dressing/grooming: Denies Difficulty climbing stairs: Denies Difficulty getting out of chair: Reports Difficulty using hands for taps, buttons, cutlery, and/or writing: Reports  Review of Systems  Constitutional: Positive for fatigue.  HENT: Negative for mouth sores, mouth dryness and nose dryness.   Eyes: Positive for discharge. Negative for pain, itching and dryness.  Respiratory: Negative for shortness of breath and difficulty breathing.   Cardiovascular: Negative for chest pain and palpitations.  Gastrointestinal: Negative for blood in stool, constipation and diarrhea.  Endocrine: Negative for increased urination.  Genitourinary: Negative for difficulty urinating.  Musculoskeletal: Positive for arthralgias, joint pain and morning stiffness. Negative for joint swelling, myalgias, muscle tenderness and myalgias.  Skin: Positive for rash. Negative for color change and redness.        eczema  Allergic/Immunologic: Negative for susceptible to infections.  Neurological: Positive for numbness. Negative for dizziness, headaches, memory loss and weakness.  Hematological: Negative for bruising/bleeding tendency.  Psychiatric/Behavioral: Negative for confusion.    PMFS History:  Patient Active Problem List   Diagnosis Date Noted  . DDD (degenerative disc disease), cervical 06/24/2020  . History of peripheral neuropathy 06/24/2020  . Essential hypertension 06/24/2020  . History of asthma 06/24/2020  . Gouty arthritis 06/24/2020    Past Medical History:  Diagnosis Date  . Asthma   . Gout   . Hypertension   . Pneumonia     Family History  Problem Relation Age of Onset  . Breast cancer Daughter    Past Surgical History:  Procedure Laterality Date  . CESAREAN SECTION     Social History   Social History Narrative  . Not on file    There is no immunization history on file for this patient.   Objective: Vital Signs: BP 118/80 (BP Location: Left Arm, Patient Position: Sitting, Cuff Size: Large)   Pulse 90   Resp 18   Ht 5' 4"  (1.626 m)   Wt (!) 219 lb (99.3 kg)   BMI 37.59 kg/m    Physical Exam Vitals and nursing note reviewed.  Constitutional:      Appearance: She is well-developed.  HENT:     Head: Normocephalic and atraumatic.  Eyes:     Conjunctiva/sclera: Conjunctivae normal.  Cardiovascular:     Rate and Rhythm: Normal rate and regular rhythm.  Heart sounds: Normal heart sounds.  Pulmonary:     Effort: Pulmonary effort is normal.     Breath sounds: Normal breath sounds.  Abdominal:     General: Bowel sounds are normal.     Palpations: Abdomen is soft.  Musculoskeletal:     Cervical back: Normal range of motion.  Lymphadenopathy:     Cervical: No cervical adenopathy.  Skin:    General: Skin is warm and dry.     Capillary Refill: Capillary refill takes less than 2 seconds.  Neurological:     Mental Status: She is alert and  oriented to person, place, and time.  Psychiatric:        Behavior: Behavior normal.      Musculoskeletal Exam: She has discomfort range of motion of her cervical spine.  There is no tenderness over thoracic or lumbar spine.  Shoulder joints, elbow joints, wrist joints, MCPs and PIPs with good range of motion with no synovitis.  Hip joints, knee joints, ankles, MTPs and PIPs with good range of motion with no synovitis.  She had difficulty getting up from the chair.  She had no difficulty raising her arms.  CDAI Exam: CDAI Score: -- Patient Global: --; Provider Global: -- Swollen: --; Tender: -- Joint Exam 06/24/2020   No joint exam has been documented for this visit   There is currently no information documented on the homunculus. Go to the Rheumatology activity and complete the homunculus joint exam.  Investigation: No additional findings.  Imaging: No results found.  Recent Labs: Lab Results  Component Value Date   WBC 6.1 09/10/2019   HGB 10.6 (L) 09/10/2019   PLT 316 09/10/2019   NA 140 09/10/2019   K 3.0 (L) 09/10/2019   CL 101 09/10/2019   CO2 27 09/10/2019   GLUCOSE 172 (H) 09/10/2019   BUN 10 09/10/2019   CREATININE 0.71 09/10/2019   BILITOT 0.7 08/22/2015   ALKPHOS 83 08/22/2015   AST 23 08/22/2015   ALT 7 (L) 08/22/2015   PROT 7.4 08/22/2015   ALBUMIN 3.6 08/22/2015   CALCIUM 8.8 (L) 09/10/2019   GFRAA >60 09/10/2019   01/15/20: RF 170, uric acid 6.0, ESR 48, ANA-, folate 19.7, TSH 2.21, VitB12 604.   Speciality Comments: No specialty comments available.  Procedures:  No procedures performed Allergies: Codeine and Erythromycin base   Assessment / Plan:     Visit Diagnoses: Myalgia -patient denies any muscle pain.  Although she gives history of a stiffness in her muscles.  She had difficulty getting up from the chair.  She had very good response to prednisone in the past.  She has had 2 courses of prednisone.  I suspect that she may have polymyalgia  rheumatica.  I will obtain sedimentation rate and some additional labs today.  I will place her on prednisone 10 mg p.o. every morning as she is having a lot of difficulty doing routine activities.  Side effects of prednisone which include immunosuppression, elevation in blood pressure, elevation of blood sugar, elevated cholesterol and loss of bone mass density were discussed at length.  Plan: CK, TSH, ANA  Rheumatoid factor positive - Apr 13, 2020 RF 170, ESR 48.  She had no synovitis on my examination at the last visit.  Patient denies any history of joint swelling or joint pain.  I advised getting hepatitis panel as she has positive rheumatoid factor but she declined.- Plan: Cyclic citrul peptide antibody, IgG  Elevated sed rate -her sed rate was elevated  in the past.  I will obtain sed rate again as she has been off prednisone.  Plan: Sedimentation rate, Serum protein electrophoresis with reflex  High risk medication use - Plan: CBC with Differential/Platelet, COMPLETE METABOLIC PANEL WITH GFR  DDD (degenerative disc disease), cervical - With a spinal stenosis.  CT scan of the cervical spine 2008.  History of bilateral radiculopathy.  Patient did not want surgery.  Followed by a spinal specialist.  Gouty arthritis - Her symptoms are well controlled on allopurinol.  History of peripheral neuropathy  Essential hypertension-her blood pressure is well controlled.  History of asthma  Orders: Orders Placed This Encounter  Procedures  . CBC with Differential/Platelet  . COMPLETE METABOLIC PANEL WITH GFR  . CK  . TSH  . Sedimentation rate  . Cyclic citrul peptide antibody, IgG  . ANA  . Serum protein electrophoresis with reflex   Meds ordered this encounter  Medications  . predniSONE (DELTASONE) 5 MG tablet    Sig: 2 tablets p.o. every morning    Dispense:  60 tablet    Refill:  1   .  Follow-Up Instructions: Return in about 4 weeks (around 07/22/2020) for Possible PMR, positive  RF.   Bo Merino, MD  Note - This record has been created using Editor, commissioning.  Chart creation errors have been sought, but may not always  have been located. Such creation errors do not reflect on  the standard of medical care.

## 2020-06-28 LAB — CBC WITH DIFFERENTIAL/PLATELET
Absolute Monocytes: 522 cells/uL (ref 200–950)
Basophils Absolute: 30 cells/uL (ref 0–200)
Basophils Relative: 0.5 %
Eosinophils Absolute: 78 cells/uL (ref 15–500)
Eosinophils Relative: 1.3 %
HCT: 33.3 % — ABNORMAL LOW (ref 35.0–45.0)
Hemoglobin: 10.8 g/dL — ABNORMAL LOW (ref 11.7–15.5)
Lymphs Abs: 2616 cells/uL (ref 850–3900)
MCH: 24.5 pg — ABNORMAL LOW (ref 27.0–33.0)
MCHC: 32.4 g/dL (ref 32.0–36.0)
MCV: 75.5 fL — ABNORMAL LOW (ref 80.0–100.0)
MPV: 10.4 fL (ref 7.5–12.5)
Monocytes Relative: 8.7 %
Neutro Abs: 2754 cells/uL (ref 1500–7800)
Neutrophils Relative %: 45.9 %
Platelets: 300 10*3/uL (ref 140–400)
RBC: 4.41 10*6/uL (ref 3.80–5.10)
RDW: 15.5 % — ABNORMAL HIGH (ref 11.0–15.0)
Total Lymphocyte: 43.6 %
WBC: 6 10*3/uL (ref 3.8–10.8)

## 2020-06-28 LAB — CYCLIC CITRUL PEPTIDE ANTIBODY, IGG: Cyclic Citrullin Peptide Ab: <16 UNITS

## 2020-06-28 LAB — COMPLETE METABOLIC PANEL WITH GFR
AG Ratio: 1.3 (calc) (ref 1.0–2.5)
ALT: 5 U/L — ABNORMAL LOW (ref 6–29)
AST: 16 U/L (ref 10–35)
Albumin: 3.8 g/dL (ref 3.6–5.1)
Alkaline phosphatase (APISO): 78 U/L (ref 37–153)
BUN: 10 mg/dL (ref 7–25)
CO2: 31 mmol/L (ref 20–32)
Calcium: 9 mg/dL (ref 8.6–10.4)
Chloride: 101 mmol/L (ref 98–110)
Creat: 0.65 mg/dL (ref 0.60–0.93)
GFR, Est African American: 104 mL/min/{1.73_m2} (ref >=60)
GFR, Est Non African American: 89 mL/min/{1.73_m2} (ref >=60)
Globulin: 3 g/dL (calc) (ref 1.9–3.7)
Glucose, Bld: 94 mg/dL (ref 65–99)
Potassium: 3.5 mmol/L (ref 3.5–5.3)
Sodium: 140 mmol/L (ref 135–146)
Total Bilirubin: 0.3 mg/dL (ref 0.2–1.2)
Total Protein: 6.8 g/dL (ref 6.1–8.1)

## 2020-06-28 LAB — SEDIMENTATION RATE: Sed Rate: 48 mm/h — ABNORMAL HIGH (ref 0–30)

## 2020-06-28 LAB — TSH: TSH: 2.56 mIU/L (ref 0.40–4.50)

## 2020-06-28 LAB — PROTEIN ELECTROPHORESIS, SERUM, WITH REFLEX
Albumin ELP: 3.7 g/dL — ABNORMAL LOW (ref 3.8–4.8)
Alpha 1: 0.3 g/dL (ref 0.2–0.3)
Alpha 2: 0.9 g/dL (ref 0.5–0.9)
Beta 2: 0.5 g/dL (ref 0.2–0.5)
Beta Globulin: 0.4 g/dL (ref 0.4–0.6)
Gamma Globulin: 1.2 g/dL (ref 0.8–1.7)
Total Protein: 7 g/dL (ref 6.1–8.1)

## 2020-06-28 LAB — CK: Total CK: 94 U/L (ref 29–143)

## 2020-06-28 LAB — ANA: Anti Nuclear Antibody (ANA): NEGATIVE

## 2020-06-28 NOTE — Progress Notes (Signed)
I will discuss results at the follow-up visit.

## 2020-06-30 ENCOUNTER — Other Ambulatory Visit: Payer: Self-pay | Admitting: Rheumatology

## 2020-06-30 ENCOUNTER — Telehealth: Payer: Self-pay | Admitting: Rheumatology

## 2020-06-30 DIAGNOSIS — Z79899 Other long term (current) drug therapy: Secondary | ICD-10-CM

## 2020-06-30 NOTE — Telephone Encounter (Signed)
Patient calling to see if she can get results of labwork before her 07/21/20 appointment. Patient is very anxious about the results. Please call to advise.

## 2020-06-30 NOTE — Telephone Encounter (Signed)
I returned patient's call.  Discussed lab results with her.  She has elevated sedimentation rate which is consistent with polymyalgia rheumatica.  She states the prednisone 10 mg p.o. every morning is not effective.  I advised her to increase prednisone to 20 mg p.o. every morning.  She will also need additional labs to start her on methotrexate in the near future.  Please have patient come in to get labs.  Please schedule a follow-up visit 1 week after the lab work is drawn.

## 2020-07-01 NOTE — Telephone Encounter (Signed)
Patient has a follow up appointment on 07/21/2020. Patient advised to come to the office on 07/14/2020 to have her labs drawn.

## 2020-07-08 NOTE — Progress Notes (Deleted)
° °  Office Visit Note  Patient: Christine Porter             Date of Birth: 20-Jun-1951           MRN: [id number]             PCP: [facility] Referring: [facility] Visit Date: 07/21/2020 Occupation: @GUAROCC @  Subjective:  No chief complaint on file.   History of Present Illness: Christine Porter is a 69 y.o. female ***   Activities of Daily Living:  Patient reports morning stiffness for *** {minute/hour:19697}.   Patient {ACTIONS;DENIES/REPORTS:21021675::"Denies"} nocturnal pain.  Difficulty dressing/grooming: {ACTIONS;DENIES/REPORTS:21021675::"Denies"} Difficulty climbing stairs: {ACTIONS;DENIES/REPORTS:21021675::"Denies"} Difficulty getting out of chair: {ACTIONS;DENIES/REPORTS:21021675::"Denies"} Difficulty using hands for taps, buttons, cutlery, and/or writing: {ACTIONS;DENIES/REPORTS:21021675::"Denies"}  No Rheumatology ROS completed.   PMFS History:  Patient Active Problem List   Diagnosis Date Noted   DDD (degenerative disc disease), cervical 06/24/2020   History of peripheral neuropathy 06/24/2020   Essential hypertension 06/24/2020   History of asthma 06/24/2020   Gouty arthritis 06/24/2020    Past Medical History:  Diagnosis Date   Asthma    Gout    Hypertension    Pneumonia     Family History  Problem Relation Age of Onset   Breast cancer Daughter    Past Surgical History:  Procedure Laterality Date   CESAREAN SECTION     Social History   Social History Narrative   Not on file    There is no immunization history on file for this patient.   Objective: Vital Signs: There were no vitals taken for this visit.   Physical Exam   Musculoskeletal Exam: ***  CDAI Exam: CDAI Score: -- Patient Global: --; Provider Global: -- Swollen: --; Tender: -- Joint Exam 07/21/2020   No joint exam has been documented for this visit   There is currently no information documented on the homunculus. Go to the  Rheumatology activity and complete the homunculus joint exam.  Investigation: No additional findings.  Imaging: No results found.  Recent Labs: Lab Results  Component Value Date   WBC 6.0 06/24/2020   HGB 10.8 (L) 06/24/2020   PLT 300 06/24/2020   NA 140 06/24/2020   K 3.5 06/24/2020   CL 101 06/24/2020   CO2 31 06/24/2020   GLUCOSE 94 06/24/2020   BUN 10 06/24/2020   CREATININE 0.65 06/24/2020   BILITOT 0.3 06/24/2020   ALKPHOS 83 08/22/2015   AST 16 06/24/2020   ALT 5 (L) 06/24/2020   PROT 6.8 06/24/2020   PROT 7.0 06/24/2020   ALBUMIN 3.6 08/22/2015   CALCIUM 9.0 06/24/2020   GFRAA 104 06/24/2020    Speciality Comments: No specialty comments available.  Procedures:  No procedures performed Allergies: Codeine and Erythromycin base   Assessment / Plan:     Visit Diagnoses: No diagnosis found.  Orders: No orders of the defined types were placed in this encounter.  No orders of the defined types were placed in this encounter.   Face-to-face time spent with patient was *** minutes. Greater than 50% of time was spent in counseling and coordination of care.  Follow-Up Instructions: No follow-ups on file.   Earnestine Mealing, CMA  Note - This record has been created using Editor, commissioning.  Chart creation errors have been sought, but may not always  have been located. Such creation errors do not reflect on  the standard of medical care.

## 2020-07-16 ENCOUNTER — Telehealth: Payer: Self-pay | Admitting: *Deleted

## 2020-07-16 NOTE — Telephone Encounter (Signed)
Patient came to office for lab work. Patient refused to have lab work completed. Dr. Estanislado Pandy notified.

## 2020-07-21 ENCOUNTER — Ambulatory Visit: Payer: Medicare HMO | Admitting: Physician Assistant

## 2020-07-21 DIAGNOSIS — Z79899 Other long term (current) drug therapy: Secondary | ICD-10-CM

## 2020-07-21 DIAGNOSIS — Z8709 Personal history of other diseases of the respiratory system: Secondary | ICD-10-CM

## 2020-07-21 DIAGNOSIS — I10 Essential (primary) hypertension: Secondary | ICD-10-CM

## 2020-07-21 DIAGNOSIS — M5030 Other cervical disc degeneration, unspecified cervical region: Secondary | ICD-10-CM

## 2020-07-21 DIAGNOSIS — R700 Elevated erythrocyte sedimentation rate: Secondary | ICD-10-CM

## 2020-07-21 DIAGNOSIS — M791 Myalgia, unspecified site: Secondary | ICD-10-CM

## 2020-07-21 DIAGNOSIS — M109 Gout, unspecified: Secondary | ICD-10-CM

## 2020-07-21 DIAGNOSIS — Z8669 Personal history of other diseases of the nervous system and sense organs: Secondary | ICD-10-CM

## 2020-07-21 DIAGNOSIS — R768 Other specified abnormal immunological findings in serum: Secondary | ICD-10-CM

## 2020-07-30 DIAGNOSIS — R69 Illness, unspecified: Secondary | ICD-10-CM | POA: Diagnosis not present

## 2020-09-08 DIAGNOSIS — Z7189 Other specified counseling: Secondary | ICD-10-CM | POA: Diagnosis not present

## 2020-09-08 DIAGNOSIS — M0689 Other specified rheumatoid arthritis, multiple sites: Secondary | ICD-10-CM | POA: Diagnosis not present

## 2020-09-08 DIAGNOSIS — I10 Essential (primary) hypertension: Secondary | ICD-10-CM | POA: Diagnosis not present

## 2020-09-08 DIAGNOSIS — E1165 Type 2 diabetes mellitus with hyperglycemia: Secondary | ICD-10-CM | POA: Diagnosis not present

## 2020-09-08 DIAGNOSIS — E669 Obesity, unspecified: Secondary | ICD-10-CM | POA: Diagnosis not present

## 2020-09-08 DIAGNOSIS — J4520 Mild intermittent asthma, uncomplicated: Secondary | ICD-10-CM | POA: Diagnosis not present

## 2020-10-29 DIAGNOSIS — I10 Essential (primary) hypertension: Secondary | ICD-10-CM | POA: Diagnosis not present

## 2020-10-29 DIAGNOSIS — J4520 Mild intermittent asthma, uncomplicated: Secondary | ICD-10-CM | POA: Diagnosis not present

## 2020-10-29 DIAGNOSIS — E1165 Type 2 diabetes mellitus with hyperglycemia: Secondary | ICD-10-CM | POA: Diagnosis not present

## 2020-10-29 DIAGNOSIS — M0689 Other specified rheumatoid arthritis, multiple sites: Secondary | ICD-10-CM | POA: Diagnosis not present

## 2020-10-29 DIAGNOSIS — M5430 Sciatica, unspecified side: Secondary | ICD-10-CM | POA: Diagnosis not present

## 2020-11-12 ENCOUNTER — Telehealth: Payer: Self-pay

## 2020-11-12 DIAGNOSIS — M5430 Sciatica, unspecified side: Secondary | ICD-10-CM | POA: Diagnosis not present

## 2020-11-12 DIAGNOSIS — J4520 Mild intermittent asthma, uncomplicated: Secondary | ICD-10-CM | POA: Diagnosis not present

## 2020-11-12 DIAGNOSIS — I10 Essential (primary) hypertension: Secondary | ICD-10-CM | POA: Diagnosis not present

## 2020-11-12 DIAGNOSIS — E1165 Type 2 diabetes mellitus with hyperglycemia: Secondary | ICD-10-CM | POA: Diagnosis not present

## 2020-11-12 NOTE — Telephone Encounter (Signed)
Aaron Edelman from [facility] called requesting patient's office visit notes to be faxed to her PCP Dr. Nolene Ebbs at [phone]

## 2020-11-12 NOTE — Telephone Encounter (Signed)
Last office note faxed to PCP

## 2021-01-20 ENCOUNTER — Other Ambulatory Visit: Payer: Self-pay | Admitting: Internal Medicine

## 2021-01-21 LAB — CBC
HCT: 36.3 % (ref 35.0–45.0)
Hemoglobin: 12 g/dL (ref 11.7–15.5)
MCH: 25.1 pg — ABNORMAL LOW (ref 27.0–33.0)
MCHC: 33.1 g/dL (ref 32.0–36.0)
MCV: 75.9 fL — ABNORMAL LOW (ref 80.0–100.0)
MPV: 10.2 fL (ref 7.5–12.5)
Platelets: 338 10*3/uL (ref 140–400)
RBC: 4.78 10*6/uL (ref 3.80–5.10)
RDW: 15.7 % — ABNORMAL HIGH (ref 11.0–15.0)
WBC: 6.3 10*3/uL (ref 3.8–10.8)

## 2021-01-21 LAB — LIPID PANEL
Cholesterol: 204 mg/dL — ABNORMAL HIGH (ref <200)
HDL: 55 mg/dL (ref >=50)
LDL Cholesterol (Calc): 126 mg/dL (calc) — ABNORMAL HIGH
Non-HDL Cholesterol (Calc): 149 mg/dL (calc) — ABNORMAL HIGH (ref <130)
Total CHOL/HDL Ratio: 3.7 (calc) (ref <5.0)
Triglycerides: 115 mg/dL (ref <150)

## 2021-01-21 LAB — COMPLETE METABOLIC PANEL WITH GFR
AG Ratio: 1.3 (calc) (ref 1.0–2.5)
ALT: 3 U/L — ABNORMAL LOW (ref 6–29)
AST: 12 U/L (ref 10–35)
Albumin: 4.1 g/dL (ref 3.6–5.1)
Alkaline phosphatase (APISO): 92 U/L (ref 37–153)
BUN: 10 mg/dL (ref 7–25)
CO2: 32 mmol/L (ref 20–32)
Calcium: 9.4 mg/dL (ref 8.6–10.4)
Chloride: 99 mmol/L (ref 98–110)
Creat: 0.74 mg/dL (ref 0.60–0.93)
GFR, Est African American: 94 mL/min/{1.73_m2} (ref >=60)
GFR, Est Non African American: 82 mL/min/{1.73_m2} (ref >=60)
Globulin: 3.2 g/dL (calc) (ref 1.9–3.7)
Glucose, Bld: 98 mg/dL (ref 65–99)
Potassium: 4.1 mmol/L (ref 3.5–5.3)
Sodium: 141 mmol/L (ref 135–146)
Total Bilirubin: 0.5 mg/dL (ref 0.2–1.2)
Total Protein: 7.3 g/dL (ref 6.1–8.1)

## 2021-01-21 LAB — TSH: TSH: 2.06 mIU/L (ref 0.40–4.50)

## 2021-01-21 LAB — URIC ACID: Uric Acid, Serum: 5.9 mg/dL (ref 2.5–7.0)

## 2021-01-21 LAB — VITAMIN D 25 HYDROXY (VIT D DEFICIENCY, FRACTURES): Vit D, 25-Hydroxy: 36 ng/mL (ref 30–100)

## 2021-05-04 ENCOUNTER — Other Ambulatory Visit: Payer: Self-pay

## 2021-05-05 ENCOUNTER — Other Ambulatory Visit: Payer: Self-pay

## 2021-05-05 ENCOUNTER — Ambulatory Visit (INDEPENDENT_AMBULATORY_CARE_PROVIDER_SITE_OTHER): Payer: Medicare HMO | Admitting: Sports Medicine

## 2021-05-05 ENCOUNTER — Encounter: Payer: Self-pay | Admitting: Sports Medicine

## 2021-05-05 DIAGNOSIS — L602 Onychogryphosis: Secondary | ICD-10-CM

## 2021-05-05 DIAGNOSIS — M79674 Pain in right toe(s): Secondary | ICD-10-CM | POA: Diagnosis not present

## 2021-05-05 DIAGNOSIS — M79675 Pain in left toe(s): Secondary | ICD-10-CM

## 2021-05-05 NOTE — Progress Notes (Signed)
Subjective: Christine Porter is a 70 y.o. female patient seen today in office with complaint of significantly painful big toenails states that she gets sharp shooting pains off and on to her big toes worse with shoes.  Patient reports that her nails have been thickened like this for years and has not gotten better and wants to consider discussion of possible nail removal.  Patient denies any other pedal complaints at this time. Patient is assisted by daughter this visit.  Patient denies a history of diabetes but does admit to a history of gout. Patient Active Problem List   Diagnosis Date Noted   DDD (degenerative disc disease), cervical 06/24/2020   History of peripheral neuropathy 06/24/2020   Essential hypertension 06/24/2020   History of asthma 06/24/2020   Gouty arthritis 06/24/2020    Current Outpatient Medications on File Prior to Visit  Medication Sig Dispense Refill   acetaminophen (TYLENOL) 500 MG tablet Take 1 tablet (500 mg total) by mouth every 6 (six) hours as needed. 30 tablet 0   albuterol (PROVENTIL HFA;VENTOLIN HFA) 108 (90 Base) MCG/ACT inhaler Inhale 1 puff into the lungs every 6 (six) hours as needed for wheezing or shortness of breath. 1 Inhaler 3   albuterol (PROVENTIL) (2.5 MG/3ML) 0.083% nebulizer solution Take 3 mLs (2.5 mg total) by nebulization every 6 (six) hours as needed for wheezing or shortness of breath. 75 mL 0   allopurinol (ZYLOPRIM) 100 MG tablet TAKE 1 TABLET BY MOUTH ONCE DAILY FOR 30 DAYS  3   amLODipine (NORVASC) 10 MG tablet Take 1 tablet by mouth daily.     amLODIPine Besylate (NORVASC PO) Take by mouth.     aspirin EC 325 MG tablet Take 325 mg by mouth daily.     atorvastatin (LIPITOR) 20 MG tablet Take 1 tablet by mouth at bedtime.     azithromycin (ZITHROMAX) 250 MG tablet Take by mouth.     cloNIDine (CATAPRES) 0.1 MG tablet Take 0.1 mg by mouth daily.     diazepam (VALIUM) 5 MG tablet SMARTSIG:1 Tablet(s) By Mouth     diclofenac sodium  (VOLTAREN) 1 % GEL Apply 2 g topically 4 (four) times daily. 1 Tube 1   glimepiride (AMARYL) 4 MG tablet Take 4 mg by mouth every morning.     ibuprofen (ADVIL,MOTRIN) 600 MG tablet Take 1 tablet (600 mg total) by mouth every 6 (six) hours as needed. 30 tablet 0   meloxicam (MOBIC) 15 MG tablet Take 1 tablet by mouth every morning.     oxyCODONE-acetaminophen (PERCOCET/ROXICET) 5-325 MG tablet Take 1 tablet by mouth 2 (two) times daily as needed.     predniSONE (DELTASONE) 5 MG tablet 2 tablets p.o. every morning 60 tablet 1   tiZANidine (ZANAFLEX) 2 MG tablet Take 2 mg by mouth 2 (two) times daily as needed.     traMADol (ULTRAM) 50 MG tablet Take 1 tablet (50 mg total) by mouth every 6 (six) hours as needed. 10 tablet 0   traZODone (DESYREL) 50 MG tablet Take 50 mg by mouth at bedtime.     triamcinolone cream (KENALOG) 0.1 % Apply 1 application topically 2 (two) times daily. 30 g 0   triamcinolone ointment (KENALOG) 0.1 % Apply topically 2 (two) times daily as needed.     Vitamin D, Ergocalciferol, (DRISDOL) 1.25 MG (50000 UNIT) CAPS capsule Take 50,000 Units by mouth once a week.     No current facility-administered medications on file prior to visit.    Allergies  Allergen Reactions   Codeine Nausea And Vomiting   Erythromycin Base     Objective: Physical Exam  General: Well developed, nourished, no acute distress, awake, alert and oriented x 3  Vascular: Dorsalis pedis artery 1/4 bilateral, Posterior tibial artery 1 1/4 bilateral, skin temperature warm to warm proximal to distal bilateral lower extremities, no varicosities, pedal hair present bilateral.  Neurological: Gross sensation present via light touch bilateral.   Dermatological: Skin is warm, dry, and supple bilateral, bilateral hallux nails are tender, long, thick, and discolored with moderate subungal debris, no webspace macerations present bilateral, no open lesions present bilateral, no callus/corns/hyperkeratotic  tissue present bilateral. No signs of infection bilateral.  Musculoskeletal: Asymptomatic bunion boney deformities noted bilateral. Muscular strength within normal limits without painon range of motion. No pain with calf compression bilateral.  Assessment and Plan:  Problem List Items Addressed This Visit   None Visit Diagnoses     Onychogryphosis    -  Primary   Toe pain, bilateral           -Examined patient.  -Discussed treatment options for painful mycotic nails. -At this time patient wants to think about treatment options and likely is considering permanent nail removal to be performed at next office visit.  I did advise patient that she would have to stop her aspirin for 1 week -Mechanically debrided and reduced mycotic nails with sterile nail nipper and dremel nail file without incident. -Patient to return as scheduled for nail procedure or sooner if symptoms worsen.  Landis Martins, DPM

## 2021-06-23 ENCOUNTER — Ambulatory Visit: Payer: Self-pay | Admitting: Sports Medicine

## 2021-06-23 ENCOUNTER — Other Ambulatory Visit: Payer: Self-pay

## 2021-06-23 ENCOUNTER — Ambulatory Visit: Payer: Medicare HMO | Admitting: Sports Medicine

## 2021-06-23 ENCOUNTER — Encounter: Payer: Self-pay | Admitting: Sports Medicine

## 2021-06-23 DIAGNOSIS — B351 Tinea unguium: Secondary | ICD-10-CM

## 2021-06-23 DIAGNOSIS — M79676 Pain in unspecified toe(s): Secondary | ICD-10-CM | POA: Diagnosis not present

## 2021-06-23 DIAGNOSIS — L602 Onychogryphosis: Secondary | ICD-10-CM

## 2021-06-23 DIAGNOSIS — M79674 Pain in right toe(s): Secondary | ICD-10-CM

## 2021-06-23 DIAGNOSIS — M79675 Pain in left toe(s): Secondary | ICD-10-CM

## 2021-06-23 NOTE — Progress Notes (Signed)
Subjective: Christine Porter is a 70 y.o. female patient seen today for follow-up evaluation of thick fungal nails.  Patient reports that she is thought about nail removal procedure especially for her right great toe and has decided that she does not want to have it removed she wants to see if there are things that we can do to help with the fungus.  Patient Active Problem List   Diagnosis Date Noted   DDD (degenerative disc disease), cervical 06/24/2020   History of peripheral neuropathy 06/24/2020   Essential hypertension 06/24/2020   History of asthma 06/24/2020   Gouty arthritis 06/24/2020    Current Outpatient Medications on File Prior to Visit  Medication Sig Dispense Refill   acetaminophen (TYLENOL) 500 MG tablet Take 1 tablet (500 mg total) by mouth every 6 (six) hours as needed. 30 tablet 0   albuterol (PROVENTIL HFA;VENTOLIN HFA) 108 (90 Base) MCG/ACT inhaler Inhale 1 puff into the lungs every 6 (six) hours as needed for wheezing or shortness of breath. 1 Inhaler 3   albuterol (PROVENTIL) (2.5 MG/3ML) 0.083% nebulizer solution Take 3 mLs (2.5 mg total) by nebulization every 6 (six) hours as needed for wheezing or shortness of breath. 75 mL 0   allopurinol (ZYLOPRIM) 100 MG tablet TAKE 1 TABLET BY MOUTH ONCE DAILY FOR 30 DAYS  3   amLODipine (NORVASC) 10 MG tablet Take 1 tablet by mouth daily.     amLODIPine Besylate (NORVASC PO) Take by mouth.     aspirin EC 325 MG tablet Take 325 mg by mouth daily.     atorvastatin (LIPITOR) 20 MG tablet Take 1 tablet by mouth at bedtime.     azithromycin (ZITHROMAX) 250 MG tablet Take by mouth.     cloNIDine (CATAPRES) 0.1 MG tablet Take 0.1 mg by mouth daily.     diazepam (VALIUM) 5 MG tablet SMARTSIG:1 Tablet(s) By Mouth     diclofenac sodium (VOLTAREN) 1 % GEL Apply 2 g topically 4 (four) times daily. 1 Tube 1   glimepiride (AMARYL) 4 MG tablet Take 4 mg by mouth every morning.     ibuprofen (ADVIL,MOTRIN) 600 MG tablet Take 1 tablet (600 mg  total) by mouth every 6 (six) hours as needed. 30 tablet 0   meloxicam (MOBIC) 15 MG tablet Take 1 tablet by mouth every morning.     oxyCODONE-acetaminophen (PERCOCET/ROXICET) 5-325 MG tablet Take 1 tablet by mouth 2 (two) times daily as needed.     predniSONE (DELTASONE) 5 MG tablet 2 tablets p.o. every morning 60 tablet 1   tiZANidine (ZANAFLEX) 2 MG tablet Take 2 mg by mouth 2 (two) times daily as needed.     traMADol (ULTRAM) 50 MG tablet Take 1 tablet (50 mg total) by mouth every 6 (six) hours as needed. 10 tablet 0   traZODone (DESYREL) 50 MG tablet Take 50 mg by mouth at bedtime.     triamcinolone cream (KENALOG) 0.1 % Apply 1 application topically 2 (two) times daily. 30 g 0   triamcinolone ointment (KENALOG) 0.1 % Apply topically 2 (two) times daily as needed.     Vitamin D, Ergocalciferol, (DRISDOL) 1.25 MG (50000 UNIT) CAPS capsule Take 50,000 Units by mouth once a week.     No current facility-administered medications on file prior to visit.    Allergies  Allergen Reactions   Codeine Nausea And Vomiting   Erythromycin Base     Objective: Physical Exam  General: Well developed, nourished, no acute distress, awake, alert and  oriented x 3  Vascular: Dorsalis pedis artery 1/4 bilateral, Posterior tibial artery 1/4 bilateral, skin temperature warm to warm proximal to distal bilateral lower extremities, no varicosities, pedal hair present bilateral.  Neurological: Gross sensation present via light touch bilateral.   Dermatological: Skin is warm, dry, and supple bilateral, bilateral hallux nails are tender, long, thick, and discolored with moderate subungal debris as compared to all other toenails, no webspace macerations present bilateral, no open lesions present bilateral, no callus/corns/hyperkeratotic tissue present bilateral. No signs of infection bilateral.  Musculoskeletal: Asymptomatic bunion boney deformities noted bilateral. Muscular strength within normal limits  without painon range of motion. No pain with calf compression bilateral.  Assessment and Plan:  Problem List Items Addressed This Visit   None Visit Diagnoses     Nail fungus    -  Primary   Relevant Orders   Fungus culture w smear   Onychogryphosis       Toe pain, bilateral       Pain due to onychomycosis of nail           -Examined patient.  -Discussed treatment options for painful mycotic nails. -Mechanically debrided and reduced mycotic nails with sterile nail nipper and dremel nail file without incident.  Fungal culture of these nails trimmings were sent to [facility] pathology.  Patient tolerated nail trim/biopsying well without need for anesthesia. -Patient to return as scheduled for fungal culture results or sooner if symptoms worsen.  Landis Martins, DPM

## 2021-07-28 ENCOUNTER — Ambulatory Visit: Payer: Medicare HMO | Admitting: Sports Medicine

## 2021-08-10 ENCOUNTER — Ambulatory Visit (HOSPITAL_COMMUNITY)
Admission: EM | Admit: 2021-08-10 | Discharge: 2021-08-10 | Disposition: A | Discharge status: Home / Self Care | Payer: Medicare HMO | Attending: Emergency Medicine | Admitting: Emergency Medicine

## 2021-08-10 ENCOUNTER — Other Ambulatory Visit: Payer: Self-pay

## 2021-08-10 ENCOUNTER — Ambulatory Visit (INDEPENDENT_AMBULATORY_CARE_PROVIDER_SITE_OTHER): Payer: Medicare HMO

## 2021-08-10 ENCOUNTER — Encounter (HOSPITAL_COMMUNITY): Payer: Self-pay | Admitting: *Deleted

## 2021-08-10 DIAGNOSIS — R079 Chest pain, unspecified: Secondary | ICD-10-CM | POA: Diagnosis not present

## 2021-08-10 DIAGNOSIS — J441 Chronic obstructive pulmonary disease with (acute) exacerbation: Secondary | ICD-10-CM | POA: Diagnosis not present

## 2021-08-10 DIAGNOSIS — R0602 Shortness of breath: Secondary | ICD-10-CM | POA: Diagnosis not present

## 2021-08-10 MED ORDER — BUDESONIDE-FORMOTEROL FUMARATE 160-4.5 MCG/ACT IN AERO: 2.0000 | INHALATION_SPRAY | Freq: Two times a day (BID) | RESPIRATORY_TRACT | 12 refills | Status: AC

## 2021-08-10 MED ORDER — SPACER/AERO-HOLD CHAMBER BAGS MISC: 0 refills | Status: AC

## 2021-08-10 MED ORDER — ALBUTEROL SULFATE HFA 108 (90 BASE) MCG/ACT IN AERS: 1.0000 | INHALATION_SPRAY | Freq: Four times a day (QID) | RESPIRATORY_TRACT | 3 refills | Status: AC | PRN

## 2021-08-10 MED ORDER — METHYLPREDNISOLONE SODIUM SUCC 125 MG IJ SOLR
125.0000 mg | Freq: Once | INTRAMUSCULAR | Status: AC
Start: 2021-08-10 — End: 2021-08-10
  Administered 2021-08-10: 125 mg via INTRAMUSCULAR

## 2021-08-10 MED ORDER — METHYLPREDNISOLONE SODIUM SUCC 125 MG IJ SOLR
INTRAMUSCULAR | Status: AC
  Filled 2021-08-10: qty 2

## 2021-08-10 MED ORDER — ALBUTEROL SULFATE HFA 108 (90 BASE) MCG/ACT IN AERS
4.0000 | INHALATION_SPRAY | Freq: Once | RESPIRATORY_TRACT | Status: AC
  Administered 2021-08-10: 4 via RESPIRATORY_TRACT

## 2021-08-10 MED ORDER — ALBUTEROL SULFATE HFA 108 (90 BASE) MCG/ACT IN AERS
INHALATION_SPRAY | RESPIRATORY_TRACT | Status: AC
  Filled 2021-08-10: qty 6.7

## 2021-08-10 MED ORDER — METHYLPREDNISOLONE 16 MG PO TABS: 16.0000 mg | ORAL_TABLET | Freq: Every day | ORAL | 0 refills | Status: AC

## 2021-08-10 NOTE — Discharge Instructions (Addendum)
Your chest x-ray was not concerning for pneumonia at all.  Based on my physical exam findings, I feel that you are experiencing an acute exacerbation of underlying COPD.  I have renewed your prescription for albuterol and would like for you to begin a short course of Symbicort, 2 puffs twice daily, and follow-up in the next 2 weeks with your primary care provider.  I have also sent prescription for methylprednisolone 16 mg for you to take once daily for 3 days.  At this time I do not feel that repeat antibiotics are indicated given my repeat exam after your albuterol treatment.  Please do keep in mind, should you begin to experience new onset fever, acute worsening of shortness of breath, altered mental status, please report to the emergency room immediately.

## 2021-08-10 NOTE — ED Triage Notes (Signed)
Pt reports going to PCP and was treated with a anti-bx but is not better. Pt feels she has PNA.

## 2021-08-10 NOTE — ED Provider Notes (Signed)
Christine Porter    CSN: [id number] Arrival date & time: 08/10/21  1612      History   Chief Complaint Chief Complaint  Patient presents with   Pneumonia    HPI Christine Porter is a 70 y.o. female.   Patient reports an upper respiratory infection for the past 3 weeks.  States she went to see her primary care provider on September 2 who provided her with a prescription for erythromycin for 5 days.  Patient states she took all as prescribed but on the fifth day began to have significant tightness in her chest and difficulty breathing.  Patient states she uses albuterol but thinks her inhaler may have run out.  Patient reports persistent dry cough, states she feels that she needs to cough up a lot of mucus and products of infection but despite her efforts is not able to move anything up and out with coughing.  Patient's heart rate is up today as well as her blood pressure, her oxygen is 98%.   Pneumonia   Past Medical History:  Diagnosis Date   Asthma    Gout    Hypertension    Pneumonia     Patient Active Problem List   Diagnosis Date Noted   DDD (degenerative disc disease), cervical 06/24/2020   History of peripheral neuropathy 06/24/2020   Essential hypertension 06/24/2020   History of asthma 06/24/2020   Gouty arthritis 06/24/2020    Past Surgical History:  Procedure Laterality Date   CESAREAN SECTION      OB History   No obstetric history on file.      Home Medications    Prior to Admission medications   Medication Sig Start Date End Date Taking? Authorizing Provider  budesonide-formoterol (SYMBICORT) 160-4.5 MCG/ACT inhaler Inhale 2 puffs into the lungs 2 (two) times daily. 08/10/21  Yes Lynden Oxford Scales, PA-C  Spacer/Aero-Hold Chamber Bags MISC Use as directed for inhalation of albuterol and Symbicort. 08/10/21  Yes Lynden Oxford Scales, PA-C  albuterol (PROVENTIL) (2.5 MG/3ML) 0.083% nebulizer solution Take 3 mLs (2.5 mg total) by  nebulization every 6 (six) hours as needed for wheezing or shortness of breath. 11/06/18   Melynda Ripple, MD  albuterol (VENTOLIN HFA) 108 (90 Base) MCG/ACT inhaler Inhale 1 puff into the lungs every 6 (six) hours as needed for wheezing or shortness of breath. 08/10/21 09/09/21  Lynden Oxford Scales, PA-C  allopurinol (ZYLOPRIM) 100 MG tablet TAKE 1 TABLET BY MOUTH ONCE DAILY FOR 30 DAYS 10/30/18   [provider]  amLODipine (NORVASC) 10 MG tablet Take 1 tablet by mouth daily. 03/24/21   [provider]  amLODIPine Besylate (NORVASC PO) Take by mouth.    [provider]  aspirin EC 325 MG tablet Take 325 mg by mouth daily.    [provider]  atorvastatin (LIPITOR) 20 MG tablet Take 1 tablet by mouth at bedtime. 03/22/21   [provider]  glimepiride (AMARYL) 4 MG tablet Take 4 mg by mouth every morning. 03/25/21   [provider]  ibuprofen (ADVIL,MOTRIN) 600 MG tablet Take 1 tablet (600 mg total) by mouth every 6 (six) hours as needed. 11/06/18   Melynda Ripple, MD  meloxicam (MOBIC) 15 MG tablet Take 1 tablet by mouth every morning. 02/08/21   [provider]  predniSONE (DELTASONE) 5 MG tablet 2 tablets p.o. every morning 06/24/20   Bo Merino, MD  tiZANidine (ZANAFLEX) 2 MG tablet Take 2 mg by mouth 2 (two) times daily as  needed. 03/23/20   [provider]  traZODone (DESYREL) 50 MG tablet Take 50 mg by mouth at bedtime. 03/24/21   [provider]  triamcinolone cream (KENALOG) 0.1 % Apply 1 application topically 2 (two) times daily. 08/17/18   Wieters, Hallie C, PA-C  triamcinolone ointment (KENALOG) 0.1 % Apply topically 2 (two) times daily as needed. 03/23/21   [provider]  Vitamin D, Ergocalciferol, (DRISDOL) 1.25 MG (50000 UNIT) CAPS capsule Take 50,000 Units by mouth once a week. 02/18/20   [provider]    Family History Family History  Problem Relation Age of Onset   Breast  cancer Daughter     Social History Social History   Tobacco Use   Smoking status: Never   Smokeless tobacco: Never  Vaping Use   Vaping Use: Never used  Substance Use Topics   Alcohol use: No   Drug use: No     Allergies   Codeine and Erythromycin base   Review of Systems Review of Systems Per HPI  Physical Exam Triage Vital Signs ED Triage Vitals  Enc Vitals Group     BP 08/10/21 1731 (!) 148/94     Pulse Rate 08/10/21 1731 (!) 102     Resp 08/10/21 1731 20     Temp 08/10/21 1731 98.8 F (37.1 C)     Temp src --      SpO2 08/10/21 1731 98 %     Weight --      Height --      Head Circumference --      Peak Flow --      Pain Score 08/10/21 1728 0     Pain Loc --      Pain Edu? --      Excl. in [place]? --    No data found.  Updated Vital Signs BP (!) 148/94   Pulse (!) 102   Temp 98.8 F (37.1 C)   Resp 20   SpO2 98%   Visual Acuity Right Eye Distance:   Left Eye Distance:   Bilateral Distance:    Right Eye Near:   Left Eye Near:    Bilateral Near:     Physical Exam Constitutional:      Appearance: She is ill-appearing.  HENT:     Head: Normocephalic and atraumatic.     Right Ear: Tympanic membrane, ear canal and external ear normal.     Left Ear: Tympanic membrane, ear canal and external ear normal.     Nose: Nose normal.     Mouth/Throat:     Mouth: Mucous membranes are moist.     Pharynx: Oropharynx is clear. No oropharyngeal exudate or posterior oropharyngeal erythema.  Eyes:     Conjunctiva/sclera: Conjunctivae normal.     Pupils: Pupils are equal, round, and reactive to light.  Cardiovascular:     Rate and Rhythm: Regular rhythm. Tachycardia present.     Heart sounds: No murmur heard.   No friction rub. No gallop.  Pulmonary:     Effort: Prolonged expiration present.     Breath sounds: Decreased air movement present. Examination of the right-upper field reveals decreased breath sounds. Examination of the left-upper field reveals  decreased breath sounds. Examination of the right-middle field reveals decreased breath sounds. Examination of the left-middle field reveals decreased breath sounds. Examination of the right-lower field reveals decreased breath sounds. Examination of the left-lower field reveals decreased breath sounds. Decreased breath sounds present. No wheezing, rhonchi or rales.  Musculoskeletal:  General: Normal range of motion.     Cervical back: Normal range of motion and neck supple.  Lymphadenopathy:     Cervical: Cervical adenopathy present.  Skin:    General: Skin is warm and dry.  Neurological:     General: No focal deficit present.     Mental Status: She is alert and oriented to person, place, and time.  Psychiatric:        Mood and Affect: Mood normal.        Behavior: Behavior normal.     UC Treatments / Results  Labs (all labs ordered are listed, but only abnormal results are displayed) Labs Reviewed - No data to display  EKG   Radiology DG Chest 2 View  Result Date: 08/10/2021 CLINICAL DATA:  Chest pain and shortness of breath. EXAM: CHEST - 2 VIEW COMPARISON:  Chest x-ray dated May 19, 2014. FINDINGS: The heart size and mediastinal contours are within normal limits. Normal pulmonary vascularity. Mild right basilar atelectasis. No focal consolidation, pleural effusion, or pneumothorax. No acute osseous abnormality. IMPRESSION: No active cardiopulmonary disease. Electronically Signed   By: Titus Dubin M.D.   On: 08/10/2021 17:58    Procedures Procedures (including critical care time)  Medications Ordered in UC Medications  albuterol (VENTOLIN HFA) 108 (90 Base) MCG/ACT inhaler 4 puff (4 puffs Inhalation Given 08/10/21 1754)    Initial Impression / Assessment and Plan / UC Course  I have reviewed the triage vital signs and the nursing notes.  Pertinent labs & imaging results that were available during my care of the patient were reviewed by me and considered in my  medical decision making (see chart for details).     Chest x-ray performed in office today is not concerning for pneumonia at this time.  Based on my physical exam findings I feel patient is having an acute exacerbation of COPD and will require steroid treatment.  Because she is diabetic I will provide this to her in the form of an inhaled LAMA ICS, Symbicort.  Patient has been given strict return precautions for acute worsening of shortness of breath, new onset fever, altered mental status. Final Clinical Impressions(s) / UC Diagnoses   Final diagnoses:  COPD exacerbation [facility])     Discharge Instructions      Your chest x-ray was not concerning for pneumonia at all.  Based on my physical exam findings, I feel that you are experiencing an acute exacerbation of underlying COPD.  I have renewed your prescription for albuterol and would like for you to begin a short course of Symbicort, 2 puffs twice daily, and follow-up in the next 2 weeks with your primary care provider.  Should you begin to experience new onset fever, acute worsening of shortness of breath, altered mental status, please report to the emergency room immediately.     ED Prescriptions     Medication Sig Dispense Auth. Provider   albuterol (VENTOLIN HFA) 108 (90 Base) MCG/ACT inhaler Inhale 1 puff into the lungs every 6 (six) hours as needed for wheezing or shortness of breath. 2 each Lynden Oxford Scales, PA-C   budesonide-formoterol (SYMBICORT) 160-4.5 MCG/ACT inhaler Inhale 2 puffs into the lungs 2 (two) times daily. 1 each Lynden Oxford Scales, PA-C   Spacer/Aero-Hold Chamber Bags MISC Use as directed for inhalation of albuterol and Symbicort. 1 Units Lynden Oxford Scales, PA-C      PDMP not reviewed this encounter.   Lynden Oxford Scales, PA-C 08/10/21 1816

## 2022-01-03 ENCOUNTER — Other Ambulatory Visit: Payer: Self-pay | Admitting: Internal Medicine

## 2022-01-04 LAB — LIPID PANEL
Cholesterol: 232 mg/dL — ABNORMAL HIGH (ref <200)
HDL: 57 mg/dL (ref >=50)
LDL Cholesterol (Calc): 149 mg/dL (calc) — ABNORMAL HIGH
Non-HDL Cholesterol (Calc): 175 mg/dL (calc) — ABNORMAL HIGH (ref <130)
Total CHOL/HDL Ratio: 4.1 (calc) (ref <5.0)
Triglycerides: 135 mg/dL (ref <150)

## 2022-01-04 LAB — COMPLETE METABOLIC PANEL WITH GFR
AG Ratio: 1.3 (calc) (ref 1.0–2.5)
ALT: 6 U/L (ref 6–29)
AST: 14 U/L (ref 10–35)
Albumin: 4.3 g/dL (ref 3.6–5.1)
Alkaline phosphatase (APISO): 100 U/L (ref 37–153)
BUN: 9 mg/dL (ref 7–25)
CO2: 29 mmol/L (ref 20–32)
Calcium: 9 mg/dL (ref 8.6–10.4)
Chloride: 95 mmol/L — ABNORMAL LOW (ref 98–110)
Creat: 0.75 mg/dL (ref 0.60–1.00)
Globulin: 3.4 g/dL (calc) (ref 1.9–3.7)
Glucose, Bld: 118 mg/dL — ABNORMAL HIGH (ref 65–99)
Potassium: 3.1 mmol/L — ABNORMAL LOW (ref 3.5–5.3)
Sodium: 139 mmol/L (ref 135–146)
Total Bilirubin: 0.4 mg/dL (ref 0.2–1.2)
Total Protein: 7.7 g/dL (ref 6.1–8.1)
eGFR: 85 mL/min/{1.73_m2} (ref >=60)

## 2022-01-04 LAB — CBC
HCT: 37 % (ref 35.0–45.0)
Hemoglobin: 11.9 g/dL (ref 11.7–15.5)
MCH: 24.4 pg — ABNORMAL LOW (ref 27.0–33.0)
MCHC: 32.2 g/dL (ref 32.0–36.0)
MCV: 76 fL — ABNORMAL LOW (ref 80.0–100.0)
MPV: 10.3 fL (ref 7.5–12.5)
Platelets: 325 10*3/uL (ref 140–400)
RBC: 4.87 10*6/uL (ref 3.80–5.10)
RDW: 15.1 % — ABNORMAL HIGH (ref 11.0–15.0)
WBC: 5.6 10*3/uL (ref 3.8–10.8)

## 2022-01-04 LAB — TSH: TSH: 2.52 mIU/L (ref 0.40–4.50)

## 2022-01-04 LAB — VITAMIN D 25 HYDROXY (VIT D DEFICIENCY, FRACTURES): Vit D, 25-Hydroxy: 41 ng/mL (ref 30–100)

## 2022-01-04 LAB — URIC ACID: Uric Acid, Serum: 8.3 mg/dL — ABNORMAL HIGH (ref 2.5–7.0)

## 2022-01-06 NOTE — Progress Notes (Signed)
[facility] Note  01/09/2022     CHIEF COMPLAINT Patient presents for Retina Evaluation   HISTORY OF PRESENT ILLNESS: Christine Porter is a 71 y.o. female who presents to the clinic today for:   HPI     Retina Evaluation   In both eyes.  This started 2 months ago.  Duration of 2 months.  Context:  near vision.  I, the attending physician,  performed the HPI with the patient and updated documentation appropriately.        Comments   New pt here for ret eval from Dr. Katy Fitch for 'outer retinal atrophy in a ring around fovea'-poss toxicity, Stargardt, cone dystrophy, ARMD etc. Pt states her reading ability has changed recently, last couple months. She is scheduled for cataract sx OS on 3/7. Dr. Katy Fitch is requesting retinal clearance before cataract sx. Pt reports shes borderline diabetic, taking Ozempic weekly.       Last edited by Bernarda Caffey, MD on 01/09/2022 10:42 PM.    Pt is here on the referral of Dr. Katy Fitch for concern of retinal dystrophy, pt states she is about to have cataract sx with Dr. Katy Fitch (on March 7), so he wanted her checked out here first, pt also sees Dr. Madilyn Hook for glasses and she was the one who referred pt to Dr. Katy Fitch, pt states she has never been told she has macular degeneration, she states there is no family hx of eye problems, she states she has no prior eye hx, no problems as a child, no problems seeing in the dark, no eye vitamins or supplements, pt endorses having high blood pressure, she takes meloxicam for osteoarthritis, she was tested for RA, but states it was ruled out  Referring physician: Debbra Riding, MD [address] [place],  [place] [address]  HISTORICAL INFORMATION:   Selected notes from the MEDICAL RECORD NUMBER Referred by Dr. Katy Fitch for concern of retinal atrophy LEE:  Ocular Hx- PMH-    CURRENT MEDICATIONS: No current outpatient medications on file. (Ophthalmic Drugs)   No current  facility-administered medications for this visit. (Ophthalmic Drugs)   Current Outpatient Medications (Other)  Medication Sig   albuterol (PROVENTIL) (2.5 MG/3ML) 0.083% nebulizer solution Take 3 mLs (2.5 mg total) by nebulization every 6 (six) hours as needed for wheezing or shortness of breath.   allopurinol (ZYLOPRIM) 100 MG tablet TAKE 1 TABLET BY MOUTH ONCE DAILY FOR 30 DAYS   amLODipine (NORVASC) 10 MG tablet Take 1 tablet by mouth daily.   amLODIPine Besylate (NORVASC PO) Take by mouth.   aspirin EC 325 MG tablet Take 325 mg by mouth daily.   atorvastatin (LIPITOR) 20 MG tablet Take 1 tablet by mouth at bedtime.   budesonide-formoterol (SYMBICORT) 160-4.5 MCG/ACT inhaler Inhale 2 puffs into the lungs 2 (two) times daily.   ibuprofen (ADVIL,MOTRIN) 600 MG tablet Take 1 tablet (600 mg total) by mouth every 6 (six) hours as needed.   meloxicam (MOBIC) 15 MG tablet Take 1 tablet by mouth every morning.   Semaglutide, 1 MG/DOSE, (OZEMPIC, 1 MG/DOSE,) 2 MG/1.5ML SOPN Inject 1 mg into the skin once a week.   Spacer/Aero-Hold Chamber [employer] Use as directed for inhalation of albuterol and Symbicort.   triamcinolone cream (KENALOG) 0.1 % Apply 1 application topically 2 (two) times daily.   triamcinolone ointment (KENALOG) 0.1 % Apply topically 2 (two) times daily as needed.   Vitamin D, Ergocalciferol, (DRISDOL) 1.25 MG (50000  UNIT) CAPS capsule Take 50,000 Units by mouth once a week.   albuterol (VENTOLIN HFA) 108 (90 Base) MCG/ACT inhaler Inhale 1 puff into the lungs every 6 (six) hours as needed for wheezing or shortness of breath.   glimepiride (AMARYL) 4 MG tablet Take 4 mg by mouth every morning. (Patient not taking: Reported on 01/09/2022)   predniSONE (DELTASONE) 5 MG tablet 2 tablets p.o. every morning (Patient not taking: Reported on 01/09/2022)   tiZANidine (ZANAFLEX) 2 MG tablet Take 2 mg by mouth 2 (two) times daily as needed. (Patient not taking: Reported on 01/09/2022)   traZODone  (DESYREL) 50 MG tablet Take 50 mg by mouth at bedtime. (Patient not taking: Reported on 01/09/2022)   No current facility-administered medications for this visit. (Other)   REVIEW OF SYSTEMS: ROS   Positive for: Musculoskeletal, Endocrine, Cardiovascular Negative for: Constitutional, Gastrointestinal, Neurological, Skin, Genitourinary, HENT, Eyes, Respiratory, Psychiatric, Allergic/Imm, Heme/Lymph Last edited by Kingsley Spittle, COT on 01/09/2022  2:15 PM.     ALLERGIES Allergies  Allergen Reactions   Codeine Nausea And Vomiting   Erythromycin Base    PAST MEDICAL HISTORY Past Medical History:  Diagnosis Date   Asthma    Gout    Hypertension    Osteoarthritis    Pneumonia    Past Surgical History:  Procedure Laterality Date   CESAREAN SECTION     FAMILY HISTORY Family History  Problem Relation Age of Onset   Breast cancer Daughter    SOCIAL HISTORY Social History   Tobacco Use   Smoking status: Never   Smokeless tobacco: Never  Vaping Use   Vaping Use: Never used  Substance Use Topics   Alcohol use: No   Drug use: No       OPHTHALMIC EXAM:  Base Eye Exam     Visual Acuity (Snellen - Linear)       Right Left   Dist cc 20/30 20/30 -1   Dist ph cc 20/25 20/30    Correction: Glasses         Tonometry (Tonopen, 2:31 PM)       Right Left   Pressure 19 19         Pupils       Dark Light Shape React APD   Right 3 2 Round Brisk None   Left 3 2 Round Brisk None         Visual Fields (Counting fingers)       Left Right    Full Full         Extraocular Movement       Right Left    Full, Ortho Full, Ortho         Neuro/Psych     Oriented x3: Yes   Mood/Affect: Normal         Dilation     Both eyes: 1.0% Mydriacyl, 2.5% Phenylephrine @ 2:32 PM           Slit Lamp and Fundus Exam     Slit Lamp Exam       Right Left   Lids/Lashes Dermatochalasis - upper lid Dermatochalasis - upper lid   Conjunctiva/Sclera Mild  Conjunctivochalasis inferiorly Mild Conjunctivochalasis inferiorly   Cornea arcus arcus   Anterior Chamber Deep and quiet Deep and quiet   Iris Round and dilated Round and dilated   Lens 3+ Nuclear sclerosis with brunescence, 3+ Cortical cataract 3+ Nuclear sclerosis with brunescence, 3+ Cortical cataract   Anterior Vitreous Posterior vitreous detachment, Mariel Kansky  ring Posterior vitreous detachment         Fundus Exam       Right Left   Disc Pink and Sharp Pink and Sharp   C/D Ratio 0.5 0.6   Macula Flat, Blunted foveal reflex, Drusen, RPE mottling, clumping and atrophy, No heme or edema, perifoveal ring of atrophy with central sparring Flat, Blunted foveal reflex, Drusen, RPE mottling, clumping and atrophy, No heme or edema, perifoveal ring of atrophy with central sparring   Vessels mild attenuation, mild tortuousity mild attenuation, mild tortuousity   Periphery Attached Attached           Refraction     Wearing Rx       Sphere Cylinder Axis   Right -2.50 +1.50 090   Left -2.00 +1.00 090         Manifest Refraction       Sphere Cylinder Axis Dist VA   Right -3.25 +1.00 090 20/30+1   Left -2.00 +1.00 085 20/30-1            IMAGING AND PROCEDURES  Imaging and Procedures for 01/09/2022  OCT, Retina - OU - Both Eyes       Right Eye Quality was good. Central Foveal Thickness: 226. Progression has no prior data. Findings include abnormal foveal contour, no IRF, no SRF, outer retinal atrophy, retinal drusen , subretinal hyper-reflective material (Central atrophy, irregular lamination, central island of ellipsoid).   Left Eye Central Foveal Thickness: 217. Findings include abnormal foveal contour, no IRF, no SRF, retinal drusen , subretinal hyper-reflective material, outer retinal atrophy (Central atrophy, irregular lamination, central island of ellipsoid).   Notes *Images captured and stored on drive  Diagnosis / Impression:  Non-exu ARMD w/ GA vs retinal  dystrophy OU  Clinical management:  See below  Abbreviations: NFP - Normal foveal profile. CME - cystoid macular edema. PED - pigment epithelial detachment. IRF - intraretinal fluid. SRF - subretinal fluid. EZ - ellipsoid zone. ERM - epiretinal membrane. ORA - outer retinal atrophy. ORT - outer retinal tubulation. SRHM - subretinal hyper-reflective material. IRHM - intraretinal hyper-reflective material      Fluorescein Angiography Optos (Transit OS)       Right Eye Progression has no prior data. Early phase findings include staining, window defect. Mid/Late phase findings include staining, window defect (No leakage, no vasculitis).   Left Eye Progression has no prior data. Early phase findings include staining, window defect. Mid/Late phase findings include staining, window defect (No leakage, no vasculitis).   Notes **Images stored on drive**  Impression: Perifoveal window defects w/ staining OU No leakage, no vasculitis OU            ASSESSMENT/PLAN:    ICD-10-CM   1. Advanced atrophic nonexudative age-related macular degeneration of both eyes without subfoveal involvement  H35.3133 OCT, Retina - OU - Both Eyes    2. Essential hypertension  I10     3. Hypertensive retinopathy of both eyes  H35.033 Fluorescein Angiography Optos (Transit OS)    4. Combined forms of age-related cataract of both eyes  H25.813      Age related macular degeneration, non-exudative, both eyes - exam and OCT show striking perifoveal ring of atrophy w/ central sparing of ellipsoid signal OU - BCVA remains good (20/25 OD, 20/30 OS)  - The incidence, anatomy, and pathology of dry AMD, risk of progression, and the AREDS and AREDS 2 study including smoking risks discussed with patient.  - Recommend amsler grid monitoring  - differential  includes macular/pattern dystrophy, central areolar dystrophy, benign concentric annular macular dystrophy or other  - no CNV or exudative disease noted on FA  or exam  - no retinal or ophthalmic interventions indicated or recommended  - monitor - clear from a retina standpoint to proceed with cataract surgery when pt and surgeon are ready   - f/u 6 weeks, DFE, OCT  2,3. Hypertensive retinopathy OU - discussed importance of tight BP control - monitor  4. Mixed Cataract OU - The symptoms of cataract, surgical options, and treatments and risks were discussed with patient. - discussed diagnosis and progression - under the expert management of Dr. Katy Fitch - clear from a retina standpoint to proceed with cataract surgery when pt and surgeon are ready  - surgery scheduled for January 31, 2022   Ophthalmic Meds Ordered this visit:  No orders of the defined types were placed in this encounter.    Return in about 6 weeks (around 02/20/2022) for non-exu ARMD OU, DFE, OCT.  There are no Patient Instructions on file for this visit.   Explained the diagnoses, plan, and follow up with the patient and they expressed understanding.  Patient expressed understanding of the importance of proper follow up care.   This document serves as a record of services personally performed by Gardiner Sleeper, MD, PhD. It was created on their behalf by Roselee Nova, COMT. The creation of this record is the provider's dictation and/or activities during the visit.  Electronically signed by: Roselee Nova, COMT 01/09/22 10:46 PM  This document serves as a record of services personally performed by Gardiner Sleeper, MD, PhD. It was created on their behalf by San Jetty. Owens Shark, OA an ophthalmic technician. The creation of this record is the provider's dictation and/or activities during the visit.    Electronically signed by: San Jetty. Owens Shark, [place] 02.13.2023 10:46 PM  Gardiner Sleeper, M.D., Ph.D. Diseases & Surgery of the Retina and Vitreous [facility]  I have reviewed the above documentation for accuracy and completeness, and I agree with the above. Gardiner Sleeper, M.D., Ph.D. 01/09/22 10:57 PM   Abbreviations: M myopia (nearsighted); A astigmatism; H hyperopia (farsighted); P presbyopia; Mrx spectacle prescription;  CTL contact lenses; OD right eye; OS left eye; OU both eyes  XT exotropia; ET esotropia; PEK punctate epithelial keratitis; PEE punctate epithelial erosions; DES dry eye syndrome; MGD meibomian gland dysfunction; ATs artificial tears; PFAT's preservative free artificial tears; Eek nuclear sclerotic cataract; PSC posterior subcapsular cataract; ERM epi-retinal membrane; PVD posterior vitreous detachment; RD retinal detachment; DM diabetes mellitus; DR diabetic retinopathy; NPDR non-proliferative diabetic retinopathy; PDR proliferative diabetic retinopathy; CSME clinically significant macular edema; DME diabetic macular edema; dbh dot blot hemorrhages; CWS cotton wool spot; POAG primary open angle glaucoma; C/D cup-to-disc ratio; HVF humphrey visual field; GVF goldmann visual field; OCT optical coherence tomography; IOP intraocular pressure; BRVO Branch retinal vein occlusion; CRVO central retinal vein occlusion; CRAO central retinal artery occlusion; BRAO branch retinal artery occlusion; RT retinal tear; SB scleral buckle; PPV pars plana vitrectomy; VH Vitreous hemorrhage; PRP panretinal laser photocoagulation; IVK intravitreal kenalog; VMT vitreomacular traction; MH Macular hole;  NVD neovascularization of the disc; NVE neovascularization elsewhere; AREDS age related eye disease study; ARMD age related macular degeneration; POAG primary open angle glaucoma; EBMD epithelial/anterior basement membrane dystrophy; ACIOL anterior chamber intraocular lens; IOL intraocular lens; PCIOL posterior chamber intraocular lens; Phaco/IOL phacoemulsification with intraocular lens placement; [place] photorefractive keratectomy; LASIK laser assisted in situ keratomileusis;  HTN hypertension; DM diabetes mellitus; COPD chronic obstructive pulmonary disease

## 2022-01-09 ENCOUNTER — Encounter (INDEPENDENT_AMBULATORY_CARE_PROVIDER_SITE_OTHER): Payer: Self-pay | Admitting: Ophthalmology

## 2022-01-09 ENCOUNTER — Ambulatory Visit (INDEPENDENT_AMBULATORY_CARE_PROVIDER_SITE_OTHER): Payer: Medicare HMO | Admitting: Ophthalmology

## 2022-01-09 ENCOUNTER — Other Ambulatory Visit: Payer: Self-pay

## 2022-01-09 DIAGNOSIS — H353133 Nonexudative age-related macular degeneration, bilateral, advanced atrophic without subfoveal involvement: Secondary | ICD-10-CM

## 2022-01-09 DIAGNOSIS — H35033 Hypertensive retinopathy, bilateral: Secondary | ICD-10-CM

## 2022-01-09 DIAGNOSIS — H25813 Combined forms of age-related cataract, bilateral: Secondary | ICD-10-CM | POA: Diagnosis not present

## 2022-01-09 DIAGNOSIS — I10 Essential (primary) hypertension: Secondary | ICD-10-CM

## 2022-02-07 ENCOUNTER — Encounter (HOSPITAL_COMMUNITY): Payer: Self-pay

## 2022-02-07 ENCOUNTER — Other Ambulatory Visit: Payer: Self-pay

## 2022-02-07 ENCOUNTER — Inpatient Hospital Stay (HOSPITAL_COMMUNITY)
Admission: EM | Admit: 2022-02-07 | Discharge: 2022-02-10 | DRG: 378 | Disposition: A | Discharge status: Home / Self Care | Payer: Medicare HMO | Attending: Family Medicine | Admitting: Family Medicine

## 2022-02-07 DIAGNOSIS — E669 Obesity, unspecified: Secondary | ICD-10-CM | POA: Diagnosis present

## 2022-02-07 DIAGNOSIS — K922 Gastrointestinal hemorrhage, unspecified: Secondary | ICD-10-CM | POA: Diagnosis not present

## 2022-02-07 DIAGNOSIS — D649 Anemia, unspecified: Secondary | ICD-10-CM | POA: Diagnosis present

## 2022-02-07 DIAGNOSIS — D128 Benign neoplasm of rectum: Secondary | ICD-10-CM | POA: Diagnosis present

## 2022-02-07 DIAGNOSIS — Z8709 Personal history of other diseases of the respiratory system: Secondary | ICD-10-CM

## 2022-02-07 DIAGNOSIS — K625 Hemorrhage of anus and rectum: Principal | ICD-10-CM

## 2022-02-07 DIAGNOSIS — D62 Acute posthemorrhagic anemia: Secondary | ICD-10-CM | POA: Diagnosis present

## 2022-02-07 DIAGNOSIS — D123 Benign neoplasm of transverse colon: Secondary | ICD-10-CM | POA: Diagnosis present

## 2022-02-07 DIAGNOSIS — K921 Melena: Secondary | ICD-10-CM | POA: Diagnosis not present

## 2022-02-07 DIAGNOSIS — E876 Hypokalemia: Secondary | ICD-10-CM

## 2022-02-07 DIAGNOSIS — J45909 Unspecified asthma, uncomplicated: Secondary | ICD-10-CM | POA: Diagnosis present

## 2022-02-07 DIAGNOSIS — Z6836 Body mass index (BMI) 36.0-36.9, adult: Secondary | ICD-10-CM

## 2022-02-07 DIAGNOSIS — Z888 Allergy status to other drugs, medicaments and biological substances status: Secondary | ICD-10-CM

## 2022-02-07 DIAGNOSIS — D500 Iron deficiency anemia secondary to blood loss (chronic): Secondary | ICD-10-CM | POA: Diagnosis not present

## 2022-02-07 DIAGNOSIS — K5731 Diverticulosis of large intestine without perforation or abscess with bleeding: Secondary | ICD-10-CM | POA: Diagnosis not present

## 2022-02-07 DIAGNOSIS — E119 Type 2 diabetes mellitus without complications: Secondary | ICD-10-CM | POA: Diagnosis not present

## 2022-02-07 DIAGNOSIS — Z885 Allergy status to narcotic agent status: Secondary | ICD-10-CM

## 2022-02-07 DIAGNOSIS — F32A Depression, unspecified: Secondary | ICD-10-CM | POA: Diagnosis present

## 2022-02-07 DIAGNOSIS — I10 Essential (primary) hypertension: Secondary | ICD-10-CM | POA: Diagnosis not present

## 2022-02-07 DIAGNOSIS — D124 Benign neoplasm of descending colon: Secondary | ICD-10-CM | POA: Diagnosis present

## 2022-02-07 LAB — COMPREHENSIVE METABOLIC PANEL
ALT: 8 U/L (ref 0–44)
AST: 16 U/L (ref 15–41)
Albumin: 3.6 g/dL (ref 3.5–5.0)
Alkaline Phosphatase: 78 U/L (ref 38–126)
Anion gap: 9 (ref 5–15)
BUN: 16 mg/dL (ref 8–23)
CO2: 26 mmol/L (ref 22–32)
Calcium: 8.3 mg/dL — ABNORMAL LOW (ref 8.9–10.3)
Chloride: 100 mmol/L (ref 98–111)
Creatinine, Ser: 0.58 mg/dL (ref 0.44–1.00)
GFR, Estimated: >60 mL/min (ref >60)
Glucose, Bld: 126 mg/dL — ABNORMAL HIGH (ref 70–99)
Potassium: 3.1 mmol/L — ABNORMAL LOW (ref 3.5–5.1)
Sodium: 135 mmol/L (ref 135–145)
Total Bilirubin: 0.3 mg/dL (ref 0.3–1.2)
Total Protein: 7 g/dL (ref 6.5–8.1)

## 2022-02-07 LAB — CBC WITH DIFFERENTIAL/PLATELET
Abs Immature Granulocytes: 0.02 10*3/uL (ref 0.00–0.07)
Basophils Absolute: 0 10*3/uL (ref 0.0–0.1)
Basophils Relative: 0 %
Eosinophils Absolute: 0.1 10*3/uL (ref 0.0–0.5)
Eosinophils Relative: 1 %
HCT: 27.5 % — ABNORMAL LOW (ref 36.0–46.0)
Hemoglobin: 9.1 g/dL — ABNORMAL LOW (ref 12.0–15.0)
Immature Granulocytes: 0 %
Lymphocytes Relative: 34 %
Lymphs Abs: 1.8 10*3/uL (ref 0.7–4.0)
MCH: 25.5 pg — ABNORMAL LOW (ref 26.0–34.0)
MCHC: 33.1 g/dL (ref 30.0–36.0)
MCV: 77 fL — ABNORMAL LOW (ref 80.0–100.0)
Monocytes Absolute: 0.5 10*3/uL (ref 0.1–1.0)
Monocytes Relative: 9 %
Neutro Abs: 3 10*3/uL (ref 1.7–7.7)
Neutrophils Relative %: 56 %
Platelets: 242 10*3/uL (ref 150–400)
RBC: 3.57 MIL/uL — ABNORMAL LOW (ref 3.87–5.11)
RDW: 15.7 % — ABNORMAL HIGH (ref 11.5–15.5)
WBC: 5.4 10*3/uL (ref 4.0–10.5)
nRBC: 0 % (ref 0.0–0.2)

## 2022-02-07 LAB — GLUCOSE, CAPILLARY
Glucose-Capillary: 151 mg/dL — ABNORMAL HIGH (ref 70–99)
Glucose-Capillary: 153 mg/dL — ABNORMAL HIGH (ref 70–99)

## 2022-02-07 LAB — HEMOGLOBIN AND HEMATOCRIT, BLOOD
HCT: 25.5 % — ABNORMAL LOW (ref 36.0–46.0)
HCT: 23.3 % — ABNORMAL LOW (ref 36.0–46.0)
Hemoglobin: 8.2 g/dL — ABNORMAL LOW (ref 12.0–15.0)
Hemoglobin: 7.6 g/dL — ABNORMAL LOW (ref 12.0–15.0)

## 2022-02-07 LAB — ABO/RH: ABO/RH(D): O POS

## 2022-02-07 LAB — CBG MONITORING, ED: Glucose-Capillary: 109 mg/dL — ABNORMAL HIGH (ref 70–99)

## 2022-02-07 MED ORDER — INSULIN ASPART 100 UNIT/ML IJ SOLN
0.0000 [IU] | Freq: Three times a day (TID) | INTRAMUSCULAR | Status: DC
  Administered 2022-02-07 – 2022-02-09 (×2): 3 [IU] via SUBCUTANEOUS
  Filled 2022-02-07: qty 0.15

## 2022-02-07 MED ORDER — HYDRALAZINE HCL 20 MG/ML IJ SOLN: 5.0000 mg | Freq: Four times a day (QID) | INTRAMUSCULAR | Status: DC | PRN

## 2022-02-07 MED ORDER — MOMETASONE FURO-FORMOTEROL FUM 200-5 MCG/ACT IN AERO
2.0000 | INHALATION_SPRAY | Freq: Two times a day (BID) | RESPIRATORY_TRACT | Status: DC
  Administered 2022-02-07 – 2022-02-10 (×6): 2 via RESPIRATORY_TRACT
  Filled 2022-02-07: qty 8.8

## 2022-02-07 MED ORDER — INSULIN ASPART 100 UNIT/ML IJ SOLN
0.0000 [IU] | Freq: Every day | INTRAMUSCULAR | Status: DC
  Filled 2022-02-07: qty 0.05

## 2022-02-07 MED ORDER — ACETAMINOPHEN 325 MG PO TABS: 650.0000 mg | ORAL_TABLET | Freq: Four times a day (QID) | ORAL | Status: DC | PRN

## 2022-02-07 MED ORDER — PANTOPRAZOLE SODIUM 40 MG IV SOLR
40.0000 mg | Freq: Two times a day (BID) | INTRAVENOUS | Status: DC
  Administered 2022-02-07 – 2022-02-09 (×4): 40 mg via INTRAVENOUS
  Filled 2022-02-07 (×4): qty 10

## 2022-02-07 MED ORDER — ONDANSETRON HCL 4 MG/2ML IJ SOLN
4.0000 mg | Freq: Once | INTRAMUSCULAR | Status: AC
  Administered 2022-02-07: 4 mg via INTRAVENOUS
  Filled 2022-02-07: qty 2

## 2022-02-07 MED ORDER — PEG-KCL-NACL-NASULF-NA ASC-C 100 G PO SOLR
0.5000 | Freq: Once | ORAL | Status: AC
  Administered 2022-02-07: 100 g via ORAL
  Filled 2022-02-07: qty 1

## 2022-02-07 MED ORDER — ALBUTEROL SULFATE (2.5 MG/3ML) 0.083% IN NEBU: 2.5000 mg | INHALATION_SOLUTION | Freq: Four times a day (QID) | RESPIRATORY_TRACT | Status: DC | PRN

## 2022-02-07 MED ORDER — ONDANSETRON HCL 4 MG PO TABS
4.0000 mg | ORAL_TABLET | Freq: Four times a day (QID) | ORAL | Status: DC | PRN
Start: 2022-02-07 — End: 2022-02-10

## 2022-02-07 MED ORDER — PEG-KCL-NACL-NASULF-NA ASC-C 100 G PO SOLR: 1.0000 | Freq: Once | ORAL | Status: DC

## 2022-02-07 MED ORDER — POTASSIUM CHLORIDE 10 MEQ/100ML IV SOLN
10.0000 meq | INTRAVENOUS | Status: AC
  Administered 2022-02-07 – 2022-02-08 (×6): 10 meq via INTRAVENOUS
  Filled 2022-02-07 (×2): qty 100

## 2022-02-07 MED ORDER — ONDANSETRON HCL 4 MG/2ML IJ SOLN: 4.0000 mg | Freq: Four times a day (QID) | INTRAMUSCULAR | Status: DC | PRN

## 2022-02-07 MED ORDER — SODIUM CHLORIDE 0.9 % IV SOLN: INTRAVENOUS | Status: DC

## 2022-02-07 MED ORDER — SODIUM CHLORIDE 0.9 % IV BOLUS
1000.0000 mL | Freq: Once | INTRAVENOUS | Status: AC
  Administered 2022-02-07: 1000 mL via INTRAVENOUS

## 2022-02-07 MED ORDER — PEG-KCL-NACL-NASULF-NA ASC-C 100 G PO SOLR
0.5000 | Freq: Once | ORAL | Status: AC
  Administered 2022-02-08: 100 g via ORAL

## 2022-02-07 MED ORDER — ACETAMINOPHEN 650 MG RE SUPP: 650.0000 mg | Freq: Four times a day (QID) | RECTAL | Status: DC | PRN

## 2022-02-07 NOTE — Consult Note (Addendum)
Consultation  Referring Provider: ERMD/ Adela Lank Primary Care Physician:  [facility], [facility] Primary Gastroenterologist:  none.  Reason for Consultation:  GI bleed  HPI: Christine Porter is a 71 y.o. female, with no prior GI history who presented to the emergency room earlier this afternoon after acute onset of bright red blood per rectum yesterday.  She says she has not had any prior history of any bleeding.  She has not had any prior GI evaluation, no colonoscopy or EGD. She says she had been feeling fine, yesterday morning felt like she had to have a bowel movement and then passed a lot of bright red blood mixed with stool.  She had a total of 7 episodes yesterday all with bright red blood, and some clots.  No associated abdominal pain or cramping, no nausea or vomiting, no diaphoresis or syncope.  She thought the bleeding had stopped but then this morning had 3 more episodes of bright red blood, not quite as much volume as yesterday and then came to the emergency room.  She has felt a little weak, no presyncope. She says she has a lot of gas and rumbling in her abdomen.  Recently appetite has been fine, no GI complaints, no changes in bowel habits, no upper GI symptoms. She does take occasional aspirin and takes meloxicam daily for arthritis, no blood thinners. No family history of GI disease that she is aware of, Prior surgery C-sections. Labs in the ER today show hemoglobin 9.1/hematocrit 27.5/MCV of 77 WBC 5.4 Potassium 3.1/BUN 16/creatinine 0.58 LFTs within normal limits  Last hemoglobin had been checked February 2023 hemoglobin 11.9/hematocrit 37.0  No abdominal imaging today Has been hemodynamically stable since arrival. Medical problems include hypertension, diabetes and osteoarthritis   Past Medical History:  Diagnosis Date   Asthma    Gout    Hypertension    Osteoarthritis    Pneumonia     Past Surgical History:  Procedure Laterality Date   CESAREAN SECTION       Prior to Admission medications   Medication Sig Start Date End Date Taking? Authorizing Provider  albuterol (PROVENTIL) (2.5 MG/3ML) 0.083% nebulizer solution Take 3 mLs (2.5 mg total) by nebulization every 6 (six) hours as needed for wheezing or shortness of breath. 11/06/18   Domenick Gong, MD  albuterol (VENTOLIN HFA) 108 (90 Base) MCG/ACT inhaler Inhale 1 puff into the lungs every 6 (six) hours as needed for wheezing or shortness of breath. 08/10/21 09/09/21  Theadora Rama Scales, PA-C  allopurinol (ZYLOPRIM) 100 MG tablet TAKE 1 TABLET BY MOUTH ONCE DAILY FOR 30 DAYS 10/30/18   [provider]  amLODipine (NORVASC) 10 MG tablet Take 1 tablet by mouth daily. 03/24/21   [provider]  amLODIPine Besylate (NORVASC PO) Take by mouth.    [provider]  aspirin EC 325 MG tablet Take 325 mg by mouth daily.    [provider]  atorvastatin (LIPITOR) 20 MG tablet Take 1 tablet by mouth at bedtime. 03/22/21   [provider]  budesonide-formoterol (SYMBICORT) 160-4.5 MCG/ACT inhaler Inhale 2 puffs into the lungs 2 (two) times daily. 08/10/21   Theadora Rama Scales, PA-C  glimepiride (AMARYL) 4 MG tablet Take 4 mg by mouth every morning. Patient not taking: Reported on 01/09/2022 03/25/21   [provider]  ibuprofen (ADVIL,MOTRIN) 600 MG tablet Take 1 tablet (600 mg total) by mouth every 6 (six) hours as needed. 11/06/18   Domenick Gong, MD  meloxicam (MOBIC) 15 MG  tablet Take 1 tablet by mouth every morning. 02/08/21   [provider]  predniSONE (DELTASONE) 5 MG tablet 2 tablets p.o. every morning Patient not taking: Reported on 01/09/2022 06/24/20   Pollyann Savoy, MD  Semaglutide, 1 MG/DOSE, (OZEMPIC, 1 MG/DOSE,) 2 MG/1.5ML SOPN Inject 1 mg into the skin once a week.    [provider]  Spacer/Aero-Hold Chamber Bags MISC Use as directed for inhalation of albuterol and Symbicort. 08/10/21   Theadora Rama Scales,  PA-C  tiZANidine (ZANAFLEX) 2 MG tablet Take 2 mg by mouth 2 (two) times daily as needed. Patient not taking: Reported on 01/09/2022 03/23/20   [provider]  traZODone (DESYREL) 50 MG tablet Take 50 mg by mouth at bedtime. Patient not taking: Reported on 01/09/2022 03/24/21   [provider]  triamcinolone cream (KENALOG) 0.1 % Apply 1 application topically 2 (two) times daily. 08/17/18   Wieters, Hallie C, PA-C  triamcinolone ointment (KENALOG) 0.1 % Apply topically 2 (two) times daily as needed. 03/23/21   [provider]  Vitamin D, Ergocalciferol, (DRISDOL) 1.25 MG (50000 UNIT) CAPS capsule Take 50,000 Units by mouth once a week. 02/18/20   [provider]    Current Facility-Administered Medications  Medication Dose Route Frequency Provider Last Rate Last Admin   0.9 %  sodium chloride infusion   Intravenous Continuous Alekh, Kshitiz, MD       acetaminophen (TYLENOL) tablet 650 mg  650 mg Oral Q6H PRN Glade Lloyd, MD       Or   acetaminophen (TYLENOL) suppository 650 mg  650 mg Rectal Q6H PRN Hanley Ben, Kshitiz, MD       albuterol (PROVENTIL) (2.5 MG/3ML) 0.083% nebulizer solution 2.5 mg  2.5 mg Nebulization Q6H PRN Alekh, Kshitiz, MD       hydrALAZINE (APRESOLINE) injection 5 mg  5 mg Intravenous Q6H PRN Alekh, Kshitiz, MD       insulin aspart (novoLOG) injection 0-15 Units  0-15 Units Subcutaneous TID WC Alekh, Kshitiz, MD       insulin aspart (novoLOG) injection 0-5 Units  0-5 Units Subcutaneous QHS Alekh, Kshitiz, MD       mometasone-formoterol (DULERA) 200-5 MCG/ACT inhaler 2 puff  2 puff Inhalation BID Hanley Ben, Kshitiz, MD       ondansetron (ZOFRAN) tablet 4 mg  4 mg Oral Q6H PRN Glade Lloyd, MD       Or   ondansetron (ZOFRAN) injection 4 mg  4 mg Intravenous Q6H PRN Alekh, Kshitiz, MD       pantoprazole (PROTONIX) injection 40 mg  40 mg Intravenous Q12H Alekh, Kshitiz, MD       potassium chloride 10 mEq in 100 mL IVPB  10 mEq Intravenous Q1 Hr x 6  Alekh, Kshitiz, MD       Current Outpatient Medications  Medication Sig Dispense Refill   albuterol (PROVENTIL) (2.5 MG/3ML) 0.083% nebulizer solution Take 3 mLs (2.5 mg total) by nebulization every 6 (six) hours as needed for wheezing or shortness of breath. 75 mL 0   albuterol (VENTOLIN HFA) 108 (90 Base) MCG/ACT inhaler Inhale 1 puff into the lungs every 6 (six) hours as needed for wheezing or shortness of breath. 2 each 3   allopurinol (ZYLOPRIM) 100 MG tablet TAKE 1 TABLET BY MOUTH ONCE DAILY FOR 30 DAYS  3   amLODipine (NORVASC) 10 MG tablet Take 1 tablet by mouth daily.     amLODIPine Besylate (NORVASC PO) Take by mouth.     aspirin EC 325 MG  tablet Take 325 mg by mouth daily.     atorvastatin (LIPITOR) 20 MG tablet Take 1 tablet by mouth at bedtime.     budesonide-formoterol (SYMBICORT) 160-4.5 MCG/ACT inhaler Inhale 2 puffs into the lungs 2 (two) times daily. 1 each 12   glimepiride (AMARYL) 4 MG tablet Take 4 mg by mouth every morning. (Patient not taking: Reported on 01/09/2022)     ibuprofen (ADVIL,MOTRIN) 600 MG tablet Take 1 tablet (600 mg total) by mouth every 6 (six) hours as needed. 30 tablet 0   meloxicam (MOBIC) 15 MG tablet Take 1 tablet by mouth every morning.     predniSONE (DELTASONE) 5 MG tablet 2 tablets p.o. every morning (Patient not taking: Reported on 01/09/2022) 60 tablet 1   Semaglutide, 1 MG/DOSE, (OZEMPIC, 1 MG/DOSE,) 2 MG/1.5ML SOPN Inject 1 mg into the skin once a week.     Spacer/Aero-Hold Chamber [employer] Use as directed for inhalation of albuterol and Symbicort. 1 Units 0   tiZANidine (ZANAFLEX) 2 MG tablet Take 2 mg by mouth 2 (two) times daily as needed. (Patient not taking: Reported on 01/09/2022)     traZODone (DESYREL) 50 MG tablet Take 50 mg by mouth at bedtime. (Patient not taking: Reported on 01/09/2022)     triamcinolone cream (KENALOG) 0.1 % Apply 1 application topically 2 (two) times daily. 30 g 0   triamcinolone ointment (KENALOG) 0.1 % Apply  topically 2 (two) times daily as needed.     Vitamin D, Ergocalciferol, (DRISDOL) 1.25 MG (50000 UNIT) CAPS capsule Take 50,000 Units by mouth once a week.      Allergies as of 02/07/2022 - Review Complete 02/07/2022  Allergen Reaction Noted   Codeine Nausea And Vomiting 05/19/2014   Erythromycin base  10/30/2018   Gabapentin Other (See Comments) 02/07/2022    Family History  Problem Relation Age of Onset   Breast cancer Daughter     Social History   Socioeconomic History   Marital status: Widowed    Spouse name: Not on file   Number of children: Not on file   Years of education: Not on file   Highest education level: Not on file  Occupational History   Not on file  Tobacco Use   Smoking status: Never   Smokeless tobacco: Never  Vaping Use   Vaping Use: Never used  Substance and Sexual Activity   Alcohol use: No   Drug use: No   Sexual activity: Not on file  Other Topics Concern   Not on file  Social History Narrative   Not on file   Social Determinants of Health   Financial Resource Strain: Not on file  Food Insecurity: Not on file  Transportation Needs: Not on file  Physical Activity: Not on file  Stress: Not on file  Social Connections: Not on file  Intimate Partner Violence: Not on file    Review of Systems: Pertinent positive and negative review of systems were noted in the above HPI section.  All other review of systems was otherwise negative.   Physical Exam: Vital signs in last 24 hours: Temp:  [98.3 F (36.8 C)] 98.3 F (36.8 C) (03/14 1326) Pulse Rate:  [82-108] 88 (03/14 1515) Resp:  [15-28] 15 (03/14 1515) BP: (120-139)/(68-86) 135/68 (03/14 1515) SpO2:  [96 %-98 %] 96 % (03/14 1515) Weight:  [97.1 kg] 97.1 kg (03/14 1331)   General:   Alert,  Well-developed, well-nourished, older African-American female pleasant and cooperative in NAD daughter at bedside Head:  Normocephalic and atraumatic. Eyes:  Sclera clear, no icterus.   Conjunctiva  pink. Ears:  Normal auditory acuity. Nose:  No deformity, discharge,  or lesions. Mouth:  No deformity or lesions.   Neck:  Supple; no masses or thyromegaly. Lungs:  Clear throughout to auscultation.   No wheezes, crackles, or rhonchi.  Heart:  Regular rate and rhythm; no murmurs, clicks, rubs,  or gallops. Abdomen:  Soft,nontender, BS active,nonpalp mass or hsm.  Low midline incisional scar Rectal: Not done Msk:  Symmetrical without gross deformities. . Pulses:  Normal pulses noted. Extremities:  Without clubbing or edema. Neurologic:  Alert and  oriented x4;  grossly normal neurologically. Skin:  Intact without significant lesions or rashes.. Psych:  Alert and cooperative. Normal mood and affect.  Intake/Output from previous day: No intake/output data recorded. Intake/Output this shift: Total I/O In: 1000 [IV Piggyback:1000] Out: -   Lab Results: Recent Labs    02/07/22 1347  WBC 5.4  HGB 9.1*  HCT 27.5*  PLT 242   BMET Recent Labs    02/07/22 1347  NA 135  K 3.1*  CL 100  CO2 26  GLUCOSE 126*  BUN 16  CREATININE 0.58  CALCIUM 8.3*   LFT Recent Labs    02/07/22 1347  PROT 7.0  ALBUMIN 3.6  AST 16  ALT 8  ALKPHOS 78  BILITOT 0.3   PT/INR No results for input(s): LABPROT, INR in the last 72 hours. Hepatitis Panel No results for input(s): HEPBSAG, HCVAB, HEPAIGM, HEPBIGM in the last 72 hours.     IMPRESSION:  #52 71 year old female with acute GI bleed, onset yesterday with multiple episodes of bright red blood. No associated abdominal pain, no nausea or vomiting No prior GI evaluation or GI history  She is hemodynamically stable but has had a 3 g drop in hemoglobin since last check about 1 month ago. Suspect lower GI bleed, likely diverticular, cannot rule out neoplasm, AVMs, or very less likely upper GI source ( normal BUN)  #2 microcytosis, rule out iron deficiency-this appears chronic for this patient #3 adult onset diabetes mellitus #4   Hypertension #5.  Osteoarthritis    PLAN: Clear liquids this evening, n.p.o. after midnight Serial hemoglobins every 6 hours and transfuse for hemoglobin less than 7.5 Patient has been scheduled for colonoscopy with Dr. Marina Goodell tomorrow afternoon Procedure was discussed in detail with the patient including indications risks and benefits and she is agreeable to proceed. We will start bowel prep this evening, and completed in a.m. Hold meloxicam Further recommendations pending findings at colonoscopy.   Amy Esterwood PA-C 02/07/2022, 4:35 PM  GI ATTENDING  History, labs, x-rays reviewed. Patient seen and examined. Agree with comprehensive H&P/Consultation note as outlined above. I personally participated in the majority of all of the elements as outlined, including the assessment and plans. The patient present with acute lower GI bleed and acute on chronic anemia. Hemodynamically stable. Agree with plans for close observation, supportive care, and colonoscopy.  Wilhemina Bonito. Eda Keys., M.D. [facility] Division of Gastroenterology

## 2022-02-07 NOTE — ED Triage Notes (Addendum)
Pt c/o rectal bleeding for the past two days, bright red, tampered off last night, came back today  ? ?Not constant, just when using the bathroom  ?No pain reported  ? ?Blood glucose 182 ?

## 2022-02-07 NOTE — H&P (Signed)
?History and Physical  ? ? ?Patient: Christine Porter NWG:[id number] DOB: 12/27/1950 ?DOA: 02/07/2022 ?DOS: the patient was seen and examined on 02/07/2022 ?PCP: [facility]  ?Patient coming from: Home ? ?Chief Complaint:  ?Rectal bleeding ? ?HPI: Christine Porter is a 71 y.o. female with medical history significant of hypertension, diabetes mellitus type 2, asthma presented with rectal bleeding that started yesterday.  Patient continued to have intermittent rectal bleeding with clots with fatigue and increased tiredness.  She denies abdominal pain or vomiting but has some gas and nausea.  Patient took a baby aspirin few days ago but does not take any other blood thinners.  Denies fever, chest pain, worsening shortness breath, dysuria, loss of consciousness, seizures.  She has never had a colonoscopy. ? ?ED course: ?Hemoglobin 9.1 (hemoglobin 11.9 on 01/03/2022).  Potassium 3.1.  ED provider consulted GI.  [facility] was subsequently called to evaluate the patient. ? ?Review of Systems: As mentioned in the history of present illness. All other systems reviewed and are negative. ?Past Medical History:  ?Diagnosis Date  ? Asthma   ? Gout   ? Hypertension   ? Osteoarthritis   ? Pneumonia   ? ?Past Surgical History:  ?Procedure Laterality Date  ? CESAREAN SECTION    ? ?Social History:  reports that she has never smoked. She has never used smokeless tobacco. She reports that she does not drink alcohol and does not use drugs. ? ?Allergies  ?Allergen Reactions  ? Codeine Nausea And Vomiting  ? Erythromycin Base   ? Gabapentin Other (See Comments)  ?  Headache  ? ? ?Family History  ?Problem Relation Age of Onset  ? Breast cancer Daughter   ? ? ?Prior to Admission medications   ?Medication Sig Start Date End Date Taking? Authorizing Provider  ?albuterol (PROVENTIL) (2.5 MG/3ML) 0.083% nebulizer solution Take 3 mLs (2.5 mg total) by nebulization every 6 (six) hours as needed for wheezing or shortness of breath. 11/06/18   Melynda Ripple, MD  ?albuterol (VENTOLIN HFA) 108 (90 Base) MCG/ACT inhaler Inhale 1 puff into the lungs every 6 (six) hours as needed for wheezing or shortness of breath. 08/10/21 09/09/21  Lynden Oxford Scales, PA-C  ?allopurinol (ZYLOPRIM) 100 MG tablet TAKE 1 TABLET BY MOUTH ONCE DAILY FOR 30 DAYS 10/30/18   [provider]  ?amLODipine (NORVASC) 10 MG tablet Take 1 tablet by mouth daily. 03/24/21   [provider]  ?amLODIPine Besylate (NORVASC PO) Take by mouth.    [provider]  ?aspirin EC 325 MG tablet Take 325 mg by mouth daily.    [provider]  ?atorvastatin (LIPITOR) 20 MG tablet Take 1 tablet by mouth at bedtime. 03/22/21   [provider]  ?budesonide-formoterol (SYMBICORT) 160-4.5 MCG/ACT inhaler Inhale 2 puffs into the lungs 2 (two) times daily. 08/10/21   Lynden Oxford Scales, PA-C  ?glimepiride (AMARYL) 4 MG tablet Take 4 mg by mouth every morning. ?Patient not taking: Reported on 01/09/2022 03/25/21   [provider]  ?ibuprofen (ADVIL,MOTRIN) 600 MG tablet Take 1 tablet (600 mg total) by mouth every 6 (six) hours as needed. 11/06/18   Melynda Ripple, MD  ?meloxicam (MOBIC) 15 MG tablet Take 1 tablet by mouth every morning. 02/08/21   [provider]  ?predniSONE (DELTASONE) 5 MG tablet 2 tablets p.o. every morning ?Patient not taking: Reported on 01/09/2022 06/24/20   Bo Merino, MD  ?Semaglutide, 1 MG/DOSE, (OZEMPIC, 1 MG/DOSE,) 2 MG/1.5ML SOPN Inject 1 mg into the  skin once a week.    [provider]  ?Spacer/Aero-Hold Chamber Bags MISC Use as directed for inhalation of albuterol and Symbicort. 08/10/21   Lynden Oxford Scales, PA-C  ?tiZANidine (ZANAFLEX) 2 MG tablet Take 2 mg by mouth 2 (two) times daily as needed. ?Patient not taking: Reported on 01/09/2022 03/23/20   [provider]  ?traZODone (DESYREL) 50 MG tablet Take 50 mg by mouth at bedtime. ?Patient not taking: Reported on 01/09/2022 03/24/21   [provider]  ?triamcinolone cream (KENALOG) 0.1 % Apply 1 application topically 2 (two) times daily. 08/17/18   Wieters, Hallie C, PA-C  ?triamcinolone ointment (KENALOG) 0.1 % Apply topically 2 (two) times daily as needed. 03/23/21   [provider]  ?Vitamin D, Ergocalciferol, (DRISDOL) 1.25 MG (50000 UNIT) CAPS capsule Take 50,000 Units by mouth once a week. 02/18/20   [provider]  ? ? ?Physical Exam: ?Vitals:  ? 02/07/22 1430 02/07/22 1445 02/07/22 1500 02/07/22 1515  ?BP: 139/86 132/76 127/78 135/68  ?Pulse: 89 82 87 88  ?Resp: 20 (!) '28 20 15  '$ ?Temp:      ?TempSrc:      ?SpO2: 96% 98% 97% 96%  ?Weight:      ?Height:      ? ?General: No acute distress, currently on room air ?ENT/neck: No elevated JVD.  No obvious masses  ?respiratory: Bilateral decreased breath sounds at bases with some scattered crackles ?CVS: S1-S2 heard, rate controlled ?Abdominal: Soft, obese, nontender, nondistended, no organomegaly, bowel sounds heard ?Extremities: No cyanosis, clubbing, edema ?CNS: Alert, awake and oriented.  No focal neurologic deficit.  Moving extremities. ?Lymph: No cervical lymphadenopathy ?Skin: No rashes, lesions, ulcers ?Psych: Normal mood, affect and judgment ?Musculoskeletal: No obvious joint deformity/tenderness/swelling ? ?Data Reviewed: ?I have reviewed patient's investigations during this hospitalization myself.  Sodium 135, potassium 3.1, creatinine 0.58, LFTs are normal.  Hemoglobin 9.1, hematocrit 27.5, platelets 242, WBC of 5.4 ? ?Assessment and Plan: ? ?Possible lower GI bleeding presenting with rectal bleeding ?Acute blood loss anemia ?-Presented with rectal bleeding and hemoglobin of 9.1 (hemoglobin 11.9 on 01/03/2022).  Patient uses baby aspirin intermittently. ?-Most likely lower GI bleeding but will start Protonix IV till patient is seen by GI ?-Clear liquid diet for now.  N.p.o. past midnight. ?-Repeat H&H in AM. ?-Follow GI recommendations ?-IV  fluids. ? ?Hypokalemia ?-Replace.  Repeat a.m. labs ? ?Hypertension ?-Monitor blood pressure.  Use IV antihypertensives as needed.  Resume home regimen in a.m. if blood pressure is stable ? ?Diabetes mellitus type 2 ?-Hold home regimen.  CBGs with SSI ? ?History of asthma ?-Currently stable.  Continue home regimen ? ?Obesity ?-Outpatient follow-up ? ? ? Advance Care Planning:   Code Status: Full Code  ? ?Consults: GI called by ED provider ? ?Family Communication: Daughter at bedside ? ?Severity of Illness: ?The appropriate patient status for this patient is OBSERVATION. Observation status is judged to be reasonable and necessary in order to provide the required intensity of service to ensure the patient's safety. The patient's presenting symptoms, physical exam findings, and initial radiographic and laboratory data in the context of their medical condition is felt to place them at decreased risk for further clinical deterioration. Furthermore, it is anticipated that the patient will be medically stable for discharge from the hospital within 2 midnights of admission.  ? ?Author: ?Aline August, MD ?02/07/2022 4:18 PM ? ?For on call review [email or web address].  ?

## 2022-02-07 NOTE — ED Provider Notes (Signed)
?[facility] DEPT ?Provider Note ? ? ?CSN: [id number] ?Arrival date & time: 02/07/22  1316 ? ?  ? ?History ? ?Chief Complaint  ?Patient presents with  ? Rectal Bleeding  ? ? ?Christine Porter is a 71 y.o. female. ? ?71 yo F with a chief complaints of bright red blood per rectum.  Had a significant amount was output yesterday.  She been feeling a bit tired and fatigued.  Has had less today but continued bleeding with some clots.  Denies abdominal pain denies vomiting but has had some nausea recently.  Denies blood thinner use but later states that sometimes she takes aspirin off and on. ? ? ?Rectal Bleeding ? ?  ? ?Home Medications ?Prior to Admission medications   ?Medication Sig Start Date End Date Taking? Authorizing Provider  ?albuterol (PROVENTIL) (2.5 MG/3ML) 0.083% nebulizer solution Take 3 mLs (2.5 mg total) by nebulization every 6 (six) hours as needed for wheezing or shortness of breath. 11/06/18   Melynda Ripple, MD  ?albuterol (VENTOLIN HFA) 108 (90 Base) MCG/ACT inhaler Inhale 1 puff into the lungs every 6 (six) hours as needed for wheezing or shortness of breath. 08/10/21 09/09/21  Lynden Oxford Scales, PA-C  ?allopurinol (ZYLOPRIM) 100 MG tablet TAKE 1 TABLET BY MOUTH ONCE DAILY FOR 30 DAYS 10/30/18   [provider]  ?amLODipine (NORVASC) 10 MG tablet Take 1 tablet by mouth daily. 03/24/21   [provider]  ?amLODIPine Besylate (NORVASC PO) Take by mouth.    [provider]  ?aspirin EC 325 MG tablet Take 325 mg by mouth daily.    [provider]  ?atorvastatin (LIPITOR) 20 MG tablet Take 1 tablet by mouth at bedtime. 03/22/21   [provider]  ?budesonide-formoterol (SYMBICORT) 160-4.5 MCG/ACT inhaler Inhale 2 puffs into the lungs 2 (two) times daily. 08/10/21   Lynden Oxford Scales, PA-C  ?glimepiride (AMARYL) 4 MG tablet Take 4 mg by mouth every morning. ?Patient not taking: Reported on 01/09/2022 03/25/21   [provider]  ?ibuprofen (ADVIL,MOTRIN) 600 MG tablet Take 1 tablet (600 mg total) by mouth every 6 (six) hours as needed. 11/06/18   Melynda Ripple, MD  ?meloxicam (MOBIC) 15 MG tablet Take 1 tablet by mouth every morning. 02/08/21   [provider]  ?predniSONE (DELTASONE) 5 MG tablet 2 tablets p.o. every morning ?Patient not taking: Reported on 01/09/2022 06/24/20   Bo Merino, MD  ?Semaglutide, 1 MG/DOSE, (OZEMPIC, 1 MG/DOSE,) 2 MG/1.5ML SOPN Inject 1 mg into the skin once a week.    [provider]  ?Spacer/Aero-Hold Chamber Bags MISC Use as directed for inhalation of albuterol and Symbicort. 08/10/21   Lynden Oxford Scales, PA-C  ?tiZANidine (ZANAFLEX) 2 MG tablet Take 2 mg by mouth 2 (two) times daily as needed. ?Patient not taking: Reported on 01/09/2022 03/23/20   [provider]  ?traZODone (DESYREL) 50 MG tablet Take 50 mg by mouth at bedtime. ?Patient not taking: Reported on 01/09/2022 03/24/21   [provider]  ?triamcinolone cream (KENALOG) 0.1 % Apply 1 application topically 2 (two) times daily. 08/17/18   Wieters, Hallie C, PA-C  ?triamcinolone ointment (KENALOG) 0.1 % Apply topically 2 (two) times daily as needed. 03/23/21   [provider]  ?Vitamin D, Ergocalciferol, (DRISDOL) 1.25 MG (50000 UNIT) CAPS capsule Take 50,000 Units by mouth once a week. 02/18/20   [provider]  ?   ? ?Allergies    ?Codeine, Erythromycin base, and Gabapentin   ? ?Review  of Systems   ?Review of Systems  ?Gastrointestinal:  Positive for hematochezia.  ? ?Physical Exam ?Updated Vital Signs ?BP 135/68   Pulse 88   Temp 98.3 ?F (36.8 ?C) (Oral)   Resp 15   Ht '5\' 4"'$  (1.626 m)   Wt 97.1 kg   SpO2 96%   BMI 36.73 kg/m?  ?Physical Exam ?Vitals and nursing note reviewed.  ?Constitutional:   ?   General: She is not in acute distress. ?   Appearance: She is well-developed. She is not diaphoretic.  ?HENT:  ?   Head: Normocephalic and atraumatic.  ?Eyes:  ?    Pupils: Pupils are equal, round, and reactive to light.  ?Cardiovascular:  ?   Rate and Rhythm: Normal rate and regular rhythm.  ?   Heart sounds: No murmur heard. ?  No friction rub. No gallop.  ?Pulmonary:  ?   Effort: Pulmonary effort is normal.  ?   Breath sounds: No wheezing or rales.  ?Abdominal:  ?   General: There is no distension.  ?   Palpations: Abdomen is soft.  ?   Tenderness: There is no abdominal tenderness.  ?Musculoskeletal:     ?   General: No tenderness.  ?   Cervical back: Normal range of motion and neck supple.  ?Skin: ?   General: Skin is warm and dry.  ?Neurological:  ?   Mental Status: She is alert and oriented to person, place, and time.  ?Psychiatric:     ?   Behavior: Behavior normal.  ? ? ?ED Results / Procedures / Treatments   ?Labs ?(all labs ordered are listed, but only abnormal results are displayed) ?Labs Reviewed  ?CBC WITH DIFFERENTIAL/PLATELET - Abnormal; Notable for the following components:  ?    Result Value  ? RBC 3.57 (*)   ? Hemoglobin 9.1 (*)   ? HCT 27.5 (*)   ? MCV 77.0 (*)   ? MCH 25.5 (*)   ? RDW 15.7 (*)   ? All other components within normal limits  ?COMPREHENSIVE METABOLIC PANEL - Abnormal; Notable for the following components:  ? Potassium 3.1 (*)   ? Glucose, Bld 126 (*)   ? Calcium 8.3 (*)   ? All other components within normal limits  ?RESP PANEL BY RT-PCR (FLU A&B, COVID) ARPGX2  ?TYPE AND SCREEN  ?ABO/RH  ? ? ?EKG ?None ? ?Radiology ?No results found. ? ?Procedures ?Procedures  ? ? ?Medications Ordered in ED ?Medications  ?sodium chloride 0.9 % bolus 1,000 mL (0 mLs Intravenous Stopped 02/07/22 1531)  ?ondansetron (ZOFRAN) injection 4 mg (4 mg Intravenous Given 02/07/22 1412)  ? ? ?ED Course/ Medical Decision Making/ A&P ?  ?                        ?Medical Decision Making ?Amount and/or Complexity of Data Reviewed ?Labs: ordered. ? ?Risk ?Prescription drug management. ? ? ?71 yo F with a chief complaints of bright red blood per rectum.  Having multiple events  since yesterday.  Tells me that she is never seen a gastroenterologist and never has had a colonoscopy.  She denies any abdominal discomfort with this.  Has had some mild nausea today.  We will check blood work urine.  Bolus of IV fluids.  Type and screen.  Reassess. ? ?Patient's hemoglobin is 9.1.  Looking back at her labs it looks like her baseline is somewhere around 10-1/2.  I discussed the case with [facility].  Recommended medical admission and they would evaluate the patient in the hospital. ? ?The patients results and plan were reviewed and discussed.   ?Any x-rays performed were independently reviewed by myself.  ? ?Differential diagnosis were considered with the presenting HPI. ? ?Medications  ?sodium chloride 0.9 % bolus 1,000 mL (0 mLs Intravenous Stopped 02/07/22 1531)  ?ondansetron (ZOFRAN) injection 4 mg (4 mg Intravenous Given 02/07/22 1412)  ? ? ?Vitals:  ? 02/07/22 1430 02/07/22 1445 02/07/22 1500 02/07/22 1515  ?BP: 139/86 132/76 127/78 135/68  ?Pulse: 89 82 87 88  ?Resp: 20 (!) '28 20 15  '$ ?Temp:      ?TempSrc:      ?SpO2: 96% 98% 97% 96%  ?Weight:      ?Height:      ? ? ?Final diagnoses:  ?BRBPR (bright red blood per rectum)  ? ? ?Admission/ observation were discussed with the admitting physician, patient and/or family and they are comfortable with the plan.  ? ? ? ? ? ? ? ? ?Final Clinical Impression(s) / ED Diagnoses ?Final diagnoses:  ?BRBPR (bright red blood per rectum)  ? ? ?Rx / DC Orders ?ED Discharge Orders   ? ? None  ? ?  ? ? ?  ?Deno Etienne, DO ?02/07/22 1557 ? ?

## 2022-02-07 NOTE — Plan of Care (Signed)

## 2022-02-08 ENCOUNTER — Encounter (HOSPITAL_COMMUNITY)
Admission: EM | Disposition: A | Discharge status: Home / Self Care | Payer: Self-pay | Source: Home / Self Care | Attending: Family Medicine

## 2022-02-08 ENCOUNTER — Observation Stay (HOSPITAL_COMMUNITY): Payer: Medicare HMO | Admitting: Anesthesiology

## 2022-02-08 ENCOUNTER — Encounter (HOSPITAL_COMMUNITY): Payer: Self-pay | Admitting: Internal Medicine

## 2022-02-08 ENCOUNTER — Observation Stay (HOSPITAL_COMMUNITY): Payer: Medicare HMO

## 2022-02-08 DIAGNOSIS — Z885 Allergy status to narcotic agent status: Secondary | ICD-10-CM | POA: Diagnosis not present

## 2022-02-08 DIAGNOSIS — D649 Anemia, unspecified: Secondary | ICD-10-CM | POA: Diagnosis present

## 2022-02-08 DIAGNOSIS — D123 Benign neoplasm of transverse colon: Secondary | ICD-10-CM | POA: Diagnosis present

## 2022-02-08 DIAGNOSIS — D124 Benign neoplasm of descending colon: Secondary | ICD-10-CM | POA: Diagnosis present

## 2022-02-08 DIAGNOSIS — K5731 Diverticulosis of large intestine without perforation or abscess with bleeding: Secondary | ICD-10-CM | POA: Diagnosis present

## 2022-02-08 DIAGNOSIS — F32A Depression, unspecified: Secondary | ICD-10-CM | POA: Diagnosis present

## 2022-02-08 DIAGNOSIS — K635 Polyp of colon: Secondary | ICD-10-CM | POA: Diagnosis not present

## 2022-02-08 DIAGNOSIS — D128 Benign neoplasm of rectum: Secondary | ICD-10-CM | POA: Diagnosis present

## 2022-02-08 DIAGNOSIS — D62 Acute posthemorrhagic anemia: Secondary | ICD-10-CM | POA: Diagnosis present

## 2022-02-08 DIAGNOSIS — I10 Essential (primary) hypertension: Secondary | ICD-10-CM

## 2022-02-08 DIAGNOSIS — Z86010 Personal history of colonic polyps: Secondary | ICD-10-CM | POA: Diagnosis not present

## 2022-02-08 DIAGNOSIS — K625 Hemorrhage of anus and rectum: Secondary | ICD-10-CM | POA: Diagnosis not present

## 2022-02-08 DIAGNOSIS — Z888 Allergy status to other drugs, medicaments and biological substances status: Secondary | ICD-10-CM | POA: Diagnosis not present

## 2022-02-08 DIAGNOSIS — E669 Obesity, unspecified: Secondary | ICD-10-CM | POA: Diagnosis present

## 2022-02-08 DIAGNOSIS — K922 Gastrointestinal hemorrhage, unspecified: Secondary | ICD-10-CM | POA: Diagnosis present

## 2022-02-08 DIAGNOSIS — E119 Type 2 diabetes mellitus without complications: Secondary | ICD-10-CM | POA: Diagnosis present

## 2022-02-08 DIAGNOSIS — J45909 Unspecified asthma, uncomplicated: Secondary | ICD-10-CM | POA: Diagnosis present

## 2022-02-08 DIAGNOSIS — Z6836 Body mass index (BMI) 36.0-36.9, adult: Secondary | ICD-10-CM | POA: Diagnosis not present

## 2022-02-08 DIAGNOSIS — E876 Hypokalemia: Secondary | ICD-10-CM | POA: Diagnosis present

## 2022-02-08 LAB — HEMOGLOBIN AND HEMATOCRIT, BLOOD
HCT: 23.4 % — ABNORMAL LOW (ref 36.0–46.0)
HCT: 22.7 % — ABNORMAL LOW (ref 36.0–46.0)
HCT: 30.7 % — ABNORMAL LOW (ref 36.0–46.0)
HCT: 28.7 % — ABNORMAL LOW (ref 36.0–46.0)
Hemoglobin: 7.6 g/dL — ABNORMAL LOW (ref 12.0–15.0)
Hemoglobin: 7.4 g/dL — ABNORMAL LOW (ref 12.0–15.0)
Hemoglobin: 10.1 g/dL — ABNORMAL LOW (ref 12.0–15.0)
Hemoglobin: 9.5 g/dL — ABNORMAL LOW (ref 12.0–15.0)

## 2022-02-08 LAB — COMPREHENSIVE METABOLIC PANEL
ALT: 7 U/L (ref 0–44)
AST: 17 U/L (ref 15–41)
Albumin: 3.3 g/dL — ABNORMAL LOW (ref 3.5–5.0)
Alkaline Phosphatase: 71 U/L (ref 38–126)
Anion gap: 9 (ref 5–15)
BUN: 10 mg/dL (ref 8–23)
CO2: 27 mmol/L (ref 22–32)
Calcium: 8 mg/dL — ABNORMAL LOW (ref 8.9–10.3)
Chloride: 102 mmol/L (ref 98–111)
Creatinine, Ser: 0.56 mg/dL (ref 0.44–1.00)
GFR, Estimated: >60 mL/min (ref >60)
Glucose, Bld: 110 mg/dL — ABNORMAL HIGH (ref 70–99)
Potassium: 3 mmol/L — ABNORMAL LOW (ref 3.5–5.1)
Sodium: 138 mmol/L (ref 135–145)
Total Bilirubin: 0.3 mg/dL (ref 0.3–1.2)
Total Protein: 6.4 g/dL — ABNORMAL LOW (ref 6.5–8.1)

## 2022-02-08 LAB — GLUCOSE, CAPILLARY
Glucose-Capillary: 111 mg/dL — ABNORMAL HIGH (ref 70–99)
Glucose-Capillary: 92 mg/dL (ref 70–99)
Glucose-Capillary: 98 mg/dL (ref 70–99)
Glucose-Capillary: 93 mg/dL (ref 70–99)
Glucose-Capillary: 156 mg/dL — ABNORMAL HIGH (ref 70–99)

## 2022-02-08 LAB — PREPARE RBC (CROSSMATCH)

## 2022-02-08 LAB — MAGNESIUM: Magnesium: 1.5 mg/dL — ABNORMAL LOW (ref 1.7–2.4)

## 2022-02-08 LAB — PROTIME-INR
INR: 1.1 (ref 0.8–1.2)
Prothrombin Time: 13.9 seconds (ref 11.4–15.2)

## 2022-02-08 SURGERY — COLONOSCOPY WITH PROPOFOL
Anesthesia: Monitor Anesthesia Care

## 2022-02-08 MED ORDER — PROPOFOL 10 MG/ML IV BOLUS
INTRAVENOUS | Status: DC | PRN
  Administered 2022-02-08: 50 mg via INTRAVENOUS
  Administered 2022-02-08: 25 mg via INTRAVENOUS

## 2022-02-08 MED ORDER — IOHEXOL 350 MG/ML SOLN
100.0000 mL | Freq: Once | INTRAVENOUS | Status: AC | PRN
  Administered 2022-02-08: 100 mL via INTRAVENOUS

## 2022-02-08 MED ORDER — EPINEPHRINE 1 MG/10ML IJ SOSY
PREFILLED_SYRINGE | INTRAMUSCULAR | Status: AC
  Filled 2022-02-08: qty 10

## 2022-02-08 MED ORDER — OXYCODONE-ACETAMINOPHEN 5-325 MG PO TABS
1.0000 | ORAL_TABLET | Freq: Four times a day (QID) | ORAL | Status: AC | PRN
  Administered 2022-02-08 (×2): 1 via ORAL
  Filled 2022-02-08 (×2): qty 1

## 2022-02-08 MED ORDER — AMLODIPINE BESYLATE 10 MG PO TABS
10.0000 mg | ORAL_TABLET | Freq: Every day | ORAL | Status: DC
  Administered 2022-02-08 – 2022-02-10 (×3): 10 mg via ORAL
  Filled 2022-02-08 (×3): qty 1

## 2022-02-08 MED ORDER — LACTATED RINGERS IV SOLN
INTRAVENOUS | Status: DC
  Administered 2022-02-08: 1000 mL via INTRAVENOUS

## 2022-02-08 MED ORDER — LIDOCAINE 2% (20 MG/ML) 5 ML SYRINGE
INTRAMUSCULAR | Status: DC | PRN
Start: 2022-02-08 — End: 2022-02-08
  Administered 2022-02-08: 40 mg via INTRAVENOUS

## 2022-02-08 MED ORDER — SODIUM CHLORIDE (PF) 0.9 % IJ SOLN
INTRAMUSCULAR | Status: AC
  Filled 2022-02-08: qty 50

## 2022-02-08 MED ORDER — PROPOFOL 500 MG/50ML IV EMUL
INTRAVENOUS | Status: DC | PRN
  Administered 2022-02-08: 100 ug/kg/min via INTRAVENOUS

## 2022-02-08 MED ORDER — OXYCODONE-ACETAMINOPHEN 5-325 MG PO TABS
1.0000 | ORAL_TABLET | Freq: Four times a day (QID) | ORAL | Status: AC | PRN
  Administered 2022-02-08: 0.5 via ORAL
  Administered 2022-02-09: 1 via ORAL
  Filled 2022-02-08 (×2): qty 1

## 2022-02-08 MED ORDER — POTASSIUM CHLORIDE 10 MEQ/100ML IV SOLN
10.0000 meq | INTRAVENOUS | Status: AC
  Administered 2022-02-08 (×2): 10 meq via INTRAVENOUS
  Filled 2022-02-08 (×2): qty 100

## 2022-02-08 MED ORDER — SODIUM CHLORIDE 0.9% IV SOLUTION: Freq: Once | INTRAVENOUS | Status: AC

## 2022-02-08 SURGICAL SUPPLY — 22 items

## 2022-02-08 NOTE — Progress Notes (Signed)
[facility] ? ?PROGRESS NOTE ? ?Christine Porter EXH:[id number] DOB: 1951-09-04 DOA: 02/07/2022 ?PCP: Pa, [facility] ? ? ?Brief HPI:   ?71 y.o. female with medical history significant of hypertension, diabetes mellitus type 2, asthma presented with rectal bleeding that started yesterday.  Patient continued to have intermittent rectal bleeding with clots with fatigue and increased tiredness.  She denies abdominal pain or vomiting but has some gas and nausea.  Patient took a baby aspirin few days ago but does not take any other blood thinners.  Denies fever, chest pain, worsening shortness breath, dysuria, loss of consciousness, seizures.  She has never had a colonoscopy. ?  ?ED course: ?Hemoglobin 9.1 (hemoglobin 11.9 on 01/03/2022). ? ? ? ?Subjective  ? ?Patient seen and examined, gastroenterology recommends obtaining CTA before colonoscopy as patient continues to have lower GI bleeding. ? ? Assessment/Plan:  ? ? ?Lower GI bleed ?-Patient has ongoing lower GI bleed ?-Likely diverticular ?-GI plans to perform colonoscopy if CT is unremarkable ? ?Hypokalemia ?-Potassium was 3.1; despite replacement yesterday ?-We will order KCl 10 meq IV x3 ?-Check serum magnesium ? ?History of asthma ?-Stable ?-Continue home regimen ? ?Hypertension ?-Blood pressure is now elevated ?-Restart home medications, amlodipine 10 mg daily ? ?Diabetes mellitus type 2 ?-CBG well controlled ?-Continue sliding scale insulin with NovoLog ? ? ?Medications ? ?  ? insulin aspart  0-15 Units Subcutaneous TID WC  ? insulin aspart  0-5 Units Subcutaneous QHS  ? mometasone-formoterol  2 puff Inhalation BID  ? pantoprazole (PROTONIX) IV  40 mg Intravenous Q12H  ? sodium chloride (PF)      ? ? ? Data Reviewed:  ? ?CBG: ? ?Recent Labs  ?Lab 02/07/22 ?2017 02/08/22 ?0727 02/08/22 ?1309 02/08/22 ?1357 02/08/22 ?1644  ?GLUCAP 153* 111* 92 98 93  ? ? ?SpO2: 100 %  ? ? ?Vitals:  ? 02/08/22 1509 02/08/22 1510 02/08/22 1520 02/08/22 1530  ?BP: 119/60 119/60  (!) 138/57 (!) 149/117  ?Pulse: 77 78 (!) 57 79  ?Resp: '17 18 18 '$ (!) 8  ?Temp: (!) 97.3 ?F (36.3 ?C)  97.7 ?F (36.5 ?C)   ?TempSrc: Temporal     ?SpO2: 100% 100% 97% 100%  ?Weight:      ?Height:      ? ? ? ? ?Data Reviewed: ? ?Basic Metabolic Panel: ?Recent Labs  ?Lab 02/07/22 ?1347 02/08/22 ?3810  ?NA 135 138  ?K 3.1* 3.0*  ?CL 100 102  ?CO2 26 27  ?GLUCOSE 126* 110*  ?BUN 16 10  ?CREATININE 0.58 0.56  ?CALCIUM 8.3* 8.0*  ? ? ?CBC: ?Recent Labs  ?Lab 02/07/22 ?1347 02/07/22 ?1836 02/07/22 ?2258 02/08/22 ?1751 02/08/22 ?1032 02/08/22 ?1728  ?WBC 5.4  --   --   --   --   --   ?NEUTROABS 3.0  --   --   --   --   --   ?HGB 9.1* 8.2* 7.6* 7.6* 7.4* 10.1*  ?HCT 27.5* 25.5* 23.3* 23.4* 22.7* 30.7*  ?MCV 77.0*  --   --   --   --   --   ?PLT 242  --   --   --   --   --   ? ? ?LFT ?Recent Labs  ?Lab 02/07/22 ?1347 02/08/22 ?0258  ?AST 16 17  ?ALT 8 7  ?ALKPHOS 78 71  ?BILITOT 0.3 0.3  ?PROT 7.0 6.4*  ?ALBUMIN 3.6 3.3*  ? ?  ?Antibiotics: ?Anti-infectives (From admission, onward)  ? ? None  ? ?  ? ? ? ?  DVT prophylaxis: SCDs ? ?Code Status: Full code ? ?Family Communication: No family at bedside ? ? ?CONSULTS gastroenterology ? ? ?Objective  ? ? ?Physical Examination: ? ? ?General-appears in no acute distress ?Heart-S1-S2, regular, no murmur auscultated ?Lungs-clear to auscultation bilaterally, no wheezing or crackles auscultated ?Abdomen-soft, nontender, no organomegaly ?Extremities-no edema in the lower extremities ?Neuro-alert, oriented x3, no focal deficit noted ? ? ?Status is: Inpatient:.  Lower GI bleed ? ? ? ?  ? ? ? ? ? ? ? ?Christine Porter ?  ?[facility] ?If 7PM-7AM, please contact night-coverage at [email or web address], ?Office  [phone] ? ? ?02/08/2022, 6:49 PM  LOS: 0 days  ? ? ? ? ? ? ? ? ? ? ?  ?

## 2022-02-08 NOTE — Progress Notes (Addendum)
Patient ID: Christine Porter, female   DOB: 1951/09/07, 70 y.o.   MRN: [id number] ? ? ? Progress Note ? ? Subjective  ? Day # 2 ? CC; GI bleed ? ? HGB 9.1>8.2> 7.6 this a.m. ? ?Patient has completed prep but says she is still bleeding-past blood with the prep during most of the night, this morning after she finished the prep has had 2 more episodes of grossly bloody stool ?Complains of feeling weak, no dizziness no abdominal pain. ? ? Objective  ? ?Vital signs in last 24 hours: ?Temp:  [98.3 ?F (36.8 ?C)-99.1 ?F (37.3 ?C)] 98.9 ?F (37.2 ?C) (03/15 0515) ?Pulse Rate:  [43-154] 84 (03/15 0515) ?Resp:  [15-28] 16 (03/15 0515) ?BP: (120-148)/(68-87) 128/69 (03/15 0515) ?SpO2:  [96 %-99 %] 96 % (03/15 0515) ?Weight:  [97.1 kg] 97.1 kg (03/14 1331) ?Last BM Date : 02/07/22 ?General:    older AA female in NAD ?Heart:  Regular rate and rhythm; no murmurs ?Lungs: Respirations even and unlabored, lungs CTA bilaterally ?Abdomen:  Soft, nontender and nondistended. Normal bowel sounds. ?Extremities:  Without edema. ?Neurologic:  Alert and oriented,  grossly normal neurologically. ?Psych:  Cooperative. Normal mood and affect. ? ?Intake/Output from previous day: ?03/14 0701 - 03/15 0700 ?In: 1002.2 [I.V.:2.2; IV [id number]] ?Out: -  ?Intake/Output this shift: ?No intake/output data recorded. ? ?Lab Results: ?Recent Labs  ?  02/07/22 ?1347 02/07/22 ?1836 02/07/22 ?2258 02/08/22 ?6712  ?WBC 5.4  --   --   --   ?HGB 9.1* 8.2* 7.6* 7.6*  ?HCT 27.5* 25.5* 23.3* 23.4*  ?PLT 242  --   --   --   ? ?BMET ?Recent Labs  ?  02/07/22 ?1347 02/08/22 ?4580  ?NA 135 138  ?K 3.1* 3.0*  ?CL 100 102  ?CO2 26 27  ?GLUCOSE 126* 110*  ?BUN 16 10  ?CREATININE 0.58 0.56  ?CALCIUM 8.3* 8.0*  ? ?LFT ?Recent Labs  ?  02/08/22 ?9983  ?PROT 6.4*  ?ALBUMIN 3.3*  ?AST 17  ?ALT 7  ?ALKPHOS 71  ?BILITOT 0.3  ? ?PT/INR ?Recent Labs  ?  02/08/22 ?3825  ?LABPROT 13.9  ?INR 1.1  ? ? ? ? ? ? ? Assessment / Plan:   ? ?#52 71 year old female with acute GI bleed onset  02/06/2022 with multiple episodes of painless bright red blood per rectum ?No nausea vomiting or hematemesis. ?No previous GI evaluation or procedures ? ?On admission she had had a 3 g drop in hemoglobin since last check about a month ago.  She has had further drop in hemoglobin to 7.6 earlier this a.m. and has continued to have active bleeding since completing the prep. ? ?Suspect acute lower GI bleed/diverticular but cannot rule out AVMs, neoplasm ? ?#2 microcytosis, rule out iron deficiency ? ?#3 hypokalemia-correcting-further replacement this morning prior to anesthesia ? ? ?Plan; keep n.p.o. ?Place second IV ?We will go ahead and transfuse 2 units as she is still having active bleeding ?Proceed with CT angio this morning, if positive then proceed to IR for embolization, if negative then plan to proceed with colonoscopy this afternoon ?Continue potassium replacement ? ?Principal Problem: ?  GI bleed ?Active Problems: ?  Essential hypertension ?  History of asthma ?  Controlled type 2 diabetes mellitus without complication, without long-term current use of insulin ([place]) ?  Hypokalemia ?  Obesity (BMI 30-39.9) ? ? ? ? LOS: 0 days  ? ?Amy Esterwood PA-C 02/08/2022, 8:36 AM ? ?GI ATTENDING ? ?Interval history and  data reviewed.  Patient seen and examined.  Agree with interval progress note.  Patient has had further bleeding.  She has completed bowel prep.  Plan on transfusing for hemoglobin of 7.6 after 3 g drop in hemoglobin and ongoing bleeding.  Also will schedule CTA.  If negative proceed with colonoscopy. ?ADDENDUM: ?CT negative for acute bleeding.  Diverticular disease noted.  We will proceed with colonoscopy. ? ?Docia Chuck. Geri Seminole., M.D. ?[facility] ?Division of Gastroenterology  ?  ?

## 2022-02-08 NOTE — Anesthesia Preprocedure Evaluation (Addendum)
Anesthesia Evaluation  ?Patient identified by MRN, date of birth, ID band ?Patient awake ? ? ? ?Reviewed: ?Allergy & Precautions, NPO status , Patient's Chart, lab work & pertinent test results ? ?History of Anesthesia Complications ?Negative for: history of anesthetic complications ? ?Airway ?Mallampati: II ? ?TM Distance: >3 FB ?Neck ROM: Full ? ? ? Dental ?no notable dental hx. ?(+) Dental Advisory Given ?  ?Pulmonary ?asthma ,  ?  ?Pulmonary exam normal ? ? ? ? ? ? ? Cardiovascular ?hypertension, Pt. on medications ?Normal cardiovascular exam ? ? ?  ?Neuro/Psych ?negative neurological ROS ?   ? GI/Hepatic ?negative GI ROS, Neg liver ROS,   ?Endo/Other  ?diabetes ? Renal/GU ?negative Renal ROS  ? ?  ?Musculoskeletal ?negative musculoskeletal ROS ?(+)  ? Abdominal ?  ?Peds ? Hematology ? ?(+) Blood dyscrasia, anemia ,   ?Anesthesia Other Findings ? ? Reproductive/Obstetrics ? ?  ? ? ? ? ? ? ? ? ? ? ? ? ? ?  ?  ? ? ? ? ? ? ? ?Anesthesia Physical ?Anesthesia Plan ? ?ASA: 3 ? ?Anesthesia Plan: MAC  ? ?Post-op Pain Management: Minimal or no pain anticipated  ? ?Induction:  ? ?PONV Risk Score and Plan: Ondansetron and Propofol infusion ? ?Airway Management Planned: Natural Airway ? ?Additional Equipment:  ? ?Intra-op Plan:  ? ?Post-operative Plan:  ? ?Informed Consent: I have reviewed the patients History and Physical, chart, labs and discussed the procedure including the risks, benefits and alternatives for the proposed anesthesia with the patient or authorized representative who has indicated his/her understanding and acceptance.  ? ? ? ?Dental advisory given ? ?Plan Discussed with: Anesthesiologist and CRNA ? ?Anesthesia Plan Comments:   ? ? ? ? ? ?Anesthesia Quick Evaluation ? ?

## 2022-02-08 NOTE — Anesthesia Procedure Notes (Signed)
Procedure Name: [place] ?Date/Time: 02/08/2022 2:42 PM ?Performed by: Renato Shin, CRNA ?Pre-anesthesia Checklist: Patient identified, Emergency Drugs available, Suction available and Patient being monitored ?Patient Re-evaluated:Patient Re-evaluated prior to induction ?Oxygen Delivery Method: Simple face mask ?Preoxygenation: Pre-oxygenation with 100% oxygen ?Induction Type: IV induction ?Placement Confirmation: positive ETCO2 and breath sounds checked- equal and bilateral ?Dental Injury: Teeth and Oropharynx as per pre-operative assessment  ? ? ? ? ?

## 2022-02-08 NOTE — Transfer of Care (Signed)
Immediate Anesthesia Transfer of Care Note ? ?Patient: Christine Porter ? ?Procedure(s) Performed: COLONOSCOPY WITH PROPOFOL ?POLYPECTOMY ? ?Patient Location: PACU and Endoscopy Unit ? ?Anesthesia Type:MAC ? ?Level of Consciousness: awake, drowsy and responds to stimulation ? ?Airway & Oxygen Therapy: Patient Spontanous Breathing and Patient connected to face mask oxygen ? ?Post-op Assessment: Report given to RN and Post -op Vital signs reviewed and stable ? ?Post vital signs: Reviewed and stable ? ?Last Vitals:  ?Vitals Value Taken Time  ?BP 119/60 02/08/22 1510  ?Temp    ?Pulse 77 02/08/22 1511  ?Resp 17 02/08/22 1511  ?SpO2 100 % 02/08/22 1511  ?Vitals shown include unvalidated device data. ? ?Last Pain:  ?Vitals:  ? 02/08/22 1350  ?TempSrc:   ?PainSc: 0-No pain  ?   ? ?  ? ?Complications: No notable events documented. ?

## 2022-02-08 NOTE — Progress Notes (Signed)
Chaplain provided Christine Porter and Christine Porter with Diplomatic Services operational officer POA documents and education.  Chaplain was able to get documents notarized.  Chaplain provided four copies to Christine Porter and Christine Porter, including the original.  Chaplain provided Financial controller with a copy to be input in [place] chart.  ? ?Chaplain also provided prayer over Christine Porter with Christine Porter. ? ? ? 02/08/22 1300  ?Clinical Encounter Type  ?Visited With Patient and family together  ?Visit Type Social support;Spiritual support  ?Referral From Family;Nurse  ?Consult/Referral To Chaplain  ?Spiritual Encounters  ?Spiritual Needs Prayer;Literature;Brochure  ? ? ?

## 2022-02-08 NOTE — Progress Notes (Signed)
Patient refused  swab for respiratory panel ?

## 2022-02-08 NOTE — Progress Notes (Signed)
IVT consulted for difficult PIV placement.  Per flowsheet, two PIV's in place.  RN Anderson Malta acknowledged 3/14 PIV was in place at last assessment.  A new PIV was placed 3/15 at 1112 in a procedural area.  RN advised if sufficient PIVs not in  place upon return to unit, place a new consult.  RN verbalized understanding. ?

## 2022-02-08 NOTE — Op Note (Signed)
[facility] ?Patient Name: Christine Porter ?Procedure Date: 02/08/2022 ?MRN: [id number] ?Attending MD: Docia Chuck. Henrene Pastor , MD ?Date of Birth: [birth year] ?CSN: [id number] ?Age: 71 ?Admit Type: Outpatient ?Procedure:                Colonoscopy with cold snare polypectomy x 4 ?Indications:              Hematochezia ?Providers:                Docia Chuck. Henrene Pastor, MD, Kary Kos RN, RN, Charlean Merl  ?                          Purcell Nails, Technician, Christell Faith, CRNA ?Referring MD:             . [facility] ?Medicines:                Monitored Anesthesia Care ?Complications:            No immediate complications. Estimated blood loss:  ?                          None. ?Estimated Blood Loss:     Estimated blood loss: none. ?Procedure:                Pre-Anesthesia Assessment: ?                          - Prior to the procedure, a History and Physical  ?                          was performed, and patient medications and  ?                          allergies were reviewed. The patient's tolerance of  ?                          previous anesthesia was also reviewed. The risks  ?                          and benefits of the procedure and the sedation  ?                          options and risks were discussed with the patient.  ?                          All questions were answered, and informed consent  ?                          was obtained. Prior Anticoagulants: The patient has  ?                          taken no previous anticoagulant or antiplatelet  ?                          agents. ASA Grade Assessment: II - A patient with  ?  mild systemic disease. After reviewing the risks  ?                          and benefits, the patient was deemed in  ?                          satisfactory condition to undergo the procedure. ?                          After obtaining informed consent, the colonoscope  ?                          was passed under direct vision. Throughout the  ?                           procedure, the patient's blood pressure, pulse, and  ?                          oxygen saturations were monitored continuously. The  ?                          CF-HQ190L ([id number]) Olympus colonoscope was  ?                          introduced through the anus and advanced to the the  ?                          cecum, identified by appendiceal orifice and  ?                          ileocecal valve. The ileocecal valve, appendiceal  ?                          orifice, and rectum were photographed. The quality  ?                          of the bowel preparation was good. The colonoscopy  ?                          was performed without difficulty. The patient  ?                          tolerated the procedure well. The bowel preparation  ?                          used was MoviPrep via split dose instruction. ?Scope In: 2:41:32 PM ?Scope Out: 3:01:36 PM ?Scope Withdrawal Time: 0 hours 15 minutes 38 seconds  ?Total Procedure Duration: 0 hours 20 minutes 4 seconds  ?Findings: ?     Four polyps were found in the rectum, descending colon and transverse  ?     colon. The polyps were 2 to 4 mm in size. These polyps were removed with  ?     a cold snare. Resection and retrieval were complete. ?     Many small and  large-mouthed diverticula were found in the entire colon.  ?     There was old blood in the colon, but no active bleeding. ?     The exam was otherwise without abnormality on direct and retroflexion  ?     views. ?Impression:               - Four 2 to 4 mm polyps in the rectum, in the  ?                          descending colon and in the transverse colon,  ?                          removed with a cold snare. Resected and retrieved. ?                          - Diverticulosis in the entire examined colon with  ?                          old blood but no active bleeding. ?                          - The examination was otherwise normal on direct  ?                          and retroflexion  views. ?Moderate Sedation: ?     none ?Recommendation:           1. Continue to monitor blood counts and stools ?                          2. Full liquid diet. Will advance as tolerated ?                          3. If the patient were to develop recurrent acute  ?                          bleeding clinical significance, consider repeat CTA  ?                          with the prospects of subsequent angiography or  ?                          embolization ?                          4. We will follow ?                          5. If polyps adenomatous, repeat colonoscopy in 5  ?                          years ?                          Discussed with patient and her daughter. They were  ?  provided a copy of this report ?                          Marland Kitchen ?Procedure Code(s):        --- Professional --- ?                          [phone], Colonoscopy, flexible; with removal of  ?                          tumor(s), polyp(s), or other lesion(s) by snare  ?                          technique ?Diagnosis Code(s):        --- Professional --- ?                          K62.1, Rectal polyp ?                          K63.5, Polyp of colon ?                          K92.1, Melena (includes Hematochezia) ?                          K57.30, Diverticulosis of large intestine without  ?                          perforation or abscess without bleeding ?CPT copyright 2019 [employer]. All rights reserved. ?The codes documented in this report are preliminary and upon coder review may  ?be revised to meet current compliance requirements. ?Docia Chuck. Henrene Pastor, MD ?02/08/2022 3:10:05 PM ?This report has been signed electronically. ?Number of Addenda: 0 ?

## 2022-02-08 NOTE — Progress Notes (Signed)
Provider paged to request pain medication after patient has had no relief with heat and cold therapy. Patient's daughter has medication at bedside..Both patient and daughter advised that patient cannot take home medications during hospital stay and I would need to obtain an order for our pharmacy to dispense the medication.Both verbalized understanding. ?

## 2022-02-09 ENCOUNTER — Encounter (HOSPITAL_COMMUNITY): Payer: Self-pay | Admitting: Internal Medicine

## 2022-02-09 DIAGNOSIS — Z86010 Personal history of colonic polyps: Secondary | ICD-10-CM | POA: Diagnosis not present

## 2022-02-09 DIAGNOSIS — K5731 Diverticulosis of large intestine without perforation or abscess with bleeding: Secondary | ICD-10-CM | POA: Diagnosis not present

## 2022-02-09 DIAGNOSIS — I10 Essential (primary) hypertension: Secondary | ICD-10-CM | POA: Diagnosis not present

## 2022-02-09 DIAGNOSIS — K625 Hemorrhage of anus and rectum: Secondary | ICD-10-CM | POA: Diagnosis not present

## 2022-02-09 DIAGNOSIS — D62 Acute posthemorrhagic anemia: Secondary | ICD-10-CM | POA: Diagnosis not present

## 2022-02-09 DIAGNOSIS — E119 Type 2 diabetes mellitus without complications: Secondary | ICD-10-CM | POA: Diagnosis not present

## 2022-02-09 LAB — TYPE AND SCREEN
ABO/RH(D): O POS
Antibody Screen: NEGATIVE
Unit division: 0
Unit division: 0

## 2022-02-09 LAB — BASIC METABOLIC PANEL
Anion gap: 6 (ref 5–15)
BUN: <5 mg/dL — ABNORMAL LOW (ref 8–23)
CO2: 26 mmol/L (ref 22–32)
Calcium: 8.2 mg/dL — ABNORMAL LOW (ref 8.9–10.3)
Chloride: 105 mmol/L (ref 98–111)
Creatinine, Ser: 0.56 mg/dL (ref 0.44–1.00)
GFR, Estimated: >60 mL/min (ref >60)
Glucose, Bld: 101 mg/dL — ABNORMAL HIGH (ref 70–99)
Potassium: 3.1 mmol/L — ABNORMAL LOW (ref 3.5–5.1)
Sodium: 137 mmol/L (ref 135–145)

## 2022-02-09 LAB — HEMOGLOBIN AND HEMATOCRIT, BLOOD
HCT: 30 % — ABNORMAL LOW (ref 36.0–46.0)
HCT: 29.8 % — ABNORMAL LOW (ref 36.0–46.0)
HCT: 29.5 % — ABNORMAL LOW (ref 36.0–46.0)
Hemoglobin: 10 g/dL — ABNORMAL LOW (ref 12.0–15.0)
Hemoglobin: 9.9 g/dL — ABNORMAL LOW (ref 12.0–15.0)
Hemoglobin: 9.8 g/dL — ABNORMAL LOW (ref 12.0–15.0)

## 2022-02-09 LAB — GLUCOSE, CAPILLARY
Glucose-Capillary: 101 mg/dL — ABNORMAL HIGH (ref 70–99)
Glucose-Capillary: 181 mg/dL — ABNORMAL HIGH (ref 70–99)
Glucose-Capillary: 93 mg/dL (ref 70–99)
Glucose-Capillary: 129 mg/dL — ABNORMAL HIGH (ref 70–99)

## 2022-02-09 LAB — CBC
HCT: 27.9 % — ABNORMAL LOW (ref 36.0–46.0)
Hemoglobin: 9.2 g/dL — ABNORMAL LOW (ref 12.0–15.0)
MCH: 26.2 pg (ref 26.0–34.0)
MCHC: 33 g/dL (ref 30.0–36.0)
MCV: 79.5 fL — ABNORMAL LOW (ref 80.0–100.0)
Platelets: 203 10*3/uL (ref 150–400)
RBC: 3.51 MIL/uL — ABNORMAL LOW (ref 3.87–5.11)
RDW: 16.5 % — ABNORMAL HIGH (ref 11.5–15.5)
WBC: 6.4 10*3/uL (ref 4.0–10.5)
nRBC: 0 % (ref 0.0–0.2)

## 2022-02-09 LAB — BPAM RBC
Blood Product Expiration Date: 202304072359
Blood Product Expiration Date: 202304072359
ISSUE DATE / TIME: 202303151059
ISSUE DATE / TIME: 202303151432
Unit Type and Rh: 5100
Unit Type and Rh: 5100

## 2022-02-09 LAB — HEMOGLOBIN A1C
Hgb A1c MFr Bld: 7.4 % — ABNORMAL HIGH (ref 4.8–5.6)
Mean Plasma Glucose: 166 mg/dL

## 2022-02-09 LAB — SURGICAL PATHOLOGY

## 2022-02-09 MED ORDER — POTASSIUM CHLORIDE 20 MEQ PO PACK
40.0000 meq | PACK | Freq: Once | ORAL | Status: AC
  Administered 2022-02-09: 40 meq via ORAL
  Filled 2022-02-09: qty 2

## 2022-02-09 MED ORDER — PANTOPRAZOLE SODIUM 40 MG PO TBEC: 40.0000 mg | DELAYED_RELEASE_TABLET | Freq: Two times a day (BID) | ORAL | Status: DC

## 2022-02-09 MED ORDER — MAGNESIUM SULFATE 2 GM/50ML IV SOLN
2.0000 g | Freq: Once | INTRAVENOUS | Status: AC
Start: 2022-02-09 — End: 2022-02-09
  Administered 2022-02-09: 2 g via INTRAVENOUS
  Filled 2022-02-09: qty 50

## 2022-02-09 MED ORDER — POTASSIUM CHLORIDE CRYS ER 20 MEQ PO TBCR
40.0000 meq | EXTENDED_RELEASE_TABLET | ORAL | Status: DC
  Administered 2022-02-09: 40 meq via ORAL
  Filled 2022-02-09: qty 2

## 2022-02-09 NOTE — Anesthesia Postprocedure Evaluation (Signed)
Anesthesia Post Note ? ?Patient: Christine Porter ? ?Procedure(s) Performed: COLONOSCOPY WITH PROPOFOL ?POLYPECTOMY ? ?  ? ?Patient location during evaluation: Endoscopy ?Anesthesia Type: MAC ?Level of consciousness: awake and alert ?Pain management: pain level controlled ?Vital Signs Assessment: post-procedure vital signs reviewed and stable ?Respiratory status: spontaneous breathing, nonlabored ventilation, respiratory function stable and patient connected to nasal cannula oxygen ?Cardiovascular status: blood pressure returned to baseline and stable ?Postop Assessment: no apparent nausea or vomiting ?Anesthetic complications: no ? ? ?No notable events documented. ? ?Last Vitals:  ?Vitals:  ? 02/09/22 0827 02/09/22 1331  ?BP:  (!) 147/86  ?Pulse: 88 92  ?Resp: 18 20  ?Temp:  36.7 ?C  ?SpO2: 99% 97%  ?  ?Last Pain:  ?Vitals:  ? 02/09/22 0951  ?TempSrc:   ?PainSc: 4   ? ? ?  ?  ?  ?  ?  ?  ? ?Jozy Mcphearson DANIEL ? ? ? ? ?

## 2022-02-09 NOTE — Progress Notes (Signed)
PHARMACIST - PHYSICIAN COMMUNICATION ?DR:   Darrick Meigs ?CONCERNING: Proton Pump Inhibitor IV to Oral Route Change Policy ? ?The patient is receiving Protonix by the intravenous route. Based on criteria approved by the Pharmacy and [facility], the medication is being converted to the equivalent oral dose form.  ? ?These criteria include:  ?-No Active GI bleeding  ?-Able to tolerate diet of full liquids (or better) or tube feeding  ?-Able to tolerate other medications by the oral or enteral route  ? ?If you have any questions about this conversion, please contact the Pharmacy Department (ext [phone]). Thank you. ? ?Peggyann Juba, PharmD, BCPS ?Pharmacy: [phone] ? ? ?

## 2022-02-09 NOTE — Progress Notes (Signed)
[facility] ? ?PROGRESS NOTE ? ?Christine Porter GEX:[id number] DOB: [birth year] DOA: 02/07/2022 ?PCP: [facility] ? ? ?Brief HPI:   ?71 y.o. female with medical history significant of hypertension, diabetes mellitus type 2, asthma presented with rectal bleeding that started yesterday.  Patient continued to have intermittent rectal bleeding with clots with fatigue and increased tiredness.  She denies abdominal pain or vomiting but has some gas and nausea.  Patient took a baby aspirin few days ago but does not take any other blood thinners.  Denies fever, chest pain, worsening shortness breath, dysuria, loss of consciousness, seizures.  She has never had a colonoscopy. ?  ?ED course: ?Hemoglobin 9.1 (hemoglobin 11.9 on 01/03/2022). ? ? ? ?Subjective  ? ?Patient seen and examined, no more lower GI bleed.  Potassium is still low this morning.  Hemoglobin stable at 10.0 ? ? Assessment/Plan:  ? ? ?Lower GI bleed ?-Presented with lower GI bleed ?-CTA abdominal was obtained which was negative ?-Patient underwent colonoscopy yesterday, which showed diverticulosis in the entire examined colon with old blood and active bleeding.  For 2 to 4 mm polyps in the rectum, in the descending colon and the transverse colon removed with cold snare ?-GI has signed off. ?-Hemoglobin stable at 10.0 ? ? ?Hypokalemia ?-Potassium was 3.1; despite replacement yesterday ?-Serum magnesium was 1.5; will replace magnesium with 2 g magnesium sulfate IV x1 ?-We will order K-Dur 40 mg p.o. x2 ?-Follow BMP in am ? ?History of asthma ?-Stable ?-Continue home regimen ? ?Hypertension ?-Blood pressure is now elevated ?-Restart home medications, amlodipine 10 mg daily ? ?Diabetes mellitus type 2 ?-CBG well controlled ?-Continue sliding scale insulin with NovoLog ? ? ?Medications ? ?  ? amLODipine  10 mg Oral Daily  ? insulin aspart  0-15 Units Subcutaneous TID WC  ? insulin aspart  0-5 Units Subcutaneous QHS  ? mometasone-formoterol  2 puff  Inhalation BID  ? pantoprazole  40 mg Oral BID AC  ? ? ? Data Reviewed:  ? ?CBG: ? ?Recent Labs  ?Lab 02/08/22 ?1357 02/08/22 ?1644 02/08/22 ?2002 02/09/22 ?0739 02/09/22 ?1143  ?GLUCAP 98 93 156* 101* 181*  ? ? ?SpO2: 97 %  ? ? ?Vitals:  ? 02/08/22 2356 02/09/22 0500 02/09/22 0827 02/09/22 1331  ?BP: 117/67 123/75  (!) 147/86  ?Pulse: 80 75 88 92  ?Resp: '16 16 18 20  '$ ?Temp: 98.5 ?F (36.9 ?C) 98.3 ?F (36.8 ?C)  98 ?F (36.7 ?C)  ?TempSrc: Oral Oral    ?SpO2: 98% 99% 99% 97%  ?Weight:      ?Height:      ? ? ? ? ?Data Reviewed: ? ?Basic Metabolic Panel: ?Recent Labs  ?Lab 02/07/22 ?1347 02/08/22 ?0102 02/08/22 ?1926 02/09/22 ?0448  ?NA 135 138  --  137  ?K 3.1* 3.0*  --  3.1*  ?CL 100 102  --  105  ?CO2 26 27  --  26  ?GLUCOSE 126* 110*  --  101*  ?BUN 16 10  --  <5*  ?CREATININE 0.58 0.56  --  0.56  ?CALCIUM 8.3* 8.0*  --  8.2*  ?MG  --   --  1.5*  --   ? ? ?CBC: ?Recent Labs  ?Lab 02/07/22 ?1347 02/07/22 ?1836 02/08/22 ?1032 02/08/22 ?1728 02/08/22 ?2301 02/09/22 ?0448 02/09/22 ?1109  ?WBC 5.4  --   --   --   --  6.4  --   ?NEUTROABS 3.0  --   --   --   --   --   --   ?  HGB 9.1*   < > 7.4* 10.1* 9.5* 9.2* 10.0*  ?HCT 27.5*   < > 22.7* 30.7* 28.7* 27.9* 30.0*  ?MCV 77.0*  --   --   --   --  79.5*  --   ?PLT 242  --   --   --   --  203  --   ? < > = values in this interval not displayed.  ? ? ?LFT ?Recent Labs  ?Lab 02/07/22 ?1347 02/08/22 ?0017  ?AST 16 17  ?ALT 8 7  ?ALKPHOS 78 71  ?BILITOT 0.3 0.3  ?PROT 7.0 6.4*  ?ALBUMIN 3.6 3.3*  ? ?  ?Antibiotics: ?Anti-infectives (From admission, onward)  ? ? None  ? ?  ? ? ? ?DVT prophylaxis: SCDs ? ?Code Status: Full code ? ?Family Communication: No family at bedside ? ? ?CONSULTS gastroenterology ? ? ?Objective  ? ? ?Physical Examination: ? ? ?General-appears in no acute distress ?Heart-S1-S2, regular, no murmur auscultated ?Lungs-clear to auscultation bilaterally, no wheezing or crackles auscultated ?Abdomen-soft, nontender, no organomegaly ?Extremities-no edema in the lower  extremities ?Neuro-alert, oriented x3, no focal deficit noted ? ? ?Status is: Inpatient:.  Lower GI bleed ? ? ? ?  ? ? ? ? ? ? ? ?Christine Porter ?  ?[facility] ?If 7PM-7AM, please contact night-coverage at [email or web address], ?Office  [phone] ? ? ?02/09/2022, 3:16 PM  LOS: 1 day  ? ? ? ? ? ? ? ? ? ? ?  ?

## 2022-02-09 NOTE — Progress Notes (Addendum)
? ? Progress Note ? ? Subjective  ?Day #3 ?Chief Complaint: GI bleeding ? ?Hemoglobin 10.1--> 9.5--> 9.2 ? ?Today, the patient tells me that she is trying to enjoy her full liquid breakfast.  She is quite hungry.  Tells me she is tired and would like some "energy", she has had no further signs of hematochezia. ? ? Objective  ? ?Vital signs in last 24 hours: ?Temp:  [97.3 ?F (36.3 ?C)-98.9 ?F (37.2 ?C)] 98.3 ?F (36.8 ?C) (03/16 0500) ?Pulse Rate:  [57-93] 88 (03/16 0827) ?Resp:  [8-19] 18 (03/16 0827) ?BP: (117-149)/(57-117) 123/75 (03/16 0500) ?SpO2:  [96 %-100 %] 99 % (03/16 0827) ?Weight:  [97 kg] 97 kg (03/15 1350) ?Last BM Date : 02/08/22 ?General:    AA female in NAD ?Heart:  Regular rate and rhythm; no murmurs ?Lungs: Respirations even and unlabored, lungs CTA bilaterally ?Abdomen:  Soft, nontender and nondistended. Normal bowel sounds. ?Psych:  Cooperative. Normal mood and affect. ? ?Intake/Output from previous day: ?03/15 0701 - 03/16 0700 ?In: 1931 [P.O.:240; I.V.:1278.4; Blood:315; IV Piggyback:97.7] ?Out: -  ? ? ?Lab Results: ?Recent Labs  ?  02/07/22 ?1347 02/07/22 ?1836 02/08/22 ?1728 02/08/22 ?2301 02/09/22 ?0448  ?WBC 5.4  --   --   --  6.4  ?HGB 9.1*   < > 10.1* 9.5* 9.2*  ?HCT 27.5*   < > 30.7* 28.7* 27.9*  ?PLT 242  --   --   --  203  ? < > = values in this interval not displayed.  ? ?BMET ?Recent Labs  ?  02/07/22 ?1347 02/08/22 ?1308 02/09/22 ?0448  ?NA 135 138 137  ?K 3.1* 3.0* 3.1*  ?CL 100 102 105  ?CO2 '26 27 26  '$ ?GLUCOSE 126* 110* 101*  ?BUN 16 10 <5*  ?CREATININE 0.58 0.56 0.56  ?CALCIUM 8.3* 8.0* 8.2*  ? ?LFT ?Recent Labs  ?  02/08/22 ?6578  ?PROT 6.4*  ?ALBUMIN 3.3*  ?AST 17  ?ALT 7  ?ALKPHOS 71  ?BILITOT 0.3  ? ?PT/INR ?Recent Labs  ?  02/08/22 ?4696  ?LABPROT 13.9  ?INR 1.1  ? ? ?Studies/Results: ?CT Angio Abd/Pel w/ and/or w/o ? ?Result Date: 02/08/2022 ?CLINICAL DATA:  GI bleed with bright red blood per rectum. EXAM: CT ANGIOGRAPHY ABDOMEN AND PELVIS WITH CONTRAST AND WITHOUT CONTRAST  TECHNIQUE: Multidetector CT imaging of the abdomen and pelvis was performed using the standard protocol during bolus administration of intravenous contrast. Multiplanar reconstructed images and MIPs were obtained and reviewed to evaluate the vascular anatomy. RADIATION DOSE REDUCTION: This exam was performed according to the departmental dose-optimization program which includes automated exposure control, adjustment of the mA and/or kV according to patient size and/or use of iterative reconstruction technique. CONTRAST:  172m OMNIPAQUE IOHEXOL 350 MG/ML SOLN COMPARISON:  Prior CT of the abdomen and pelvis on 01/30/2007 FINDINGS: VASCULAR Aorta: Mild atherosclerosis without evidence of aneurysm, dissection or stenosis. Celiac: Mild calcified plaque at origin without significant stenosis. Normally patent branch vessels and branch vessel anatomy. SMA: Mild atherosclerosis without significant stenosis. Renals: Mild calcified plaque at the origins of bilateral single renal arteries without significant stenosis. IMA: Normally patent. Inflow: Bilateral iliac arteries demonstrate minimal atherosclerosis and normal patency without aneurysm. Proximal Outflow: Common femoral arteries and femoral bifurcations are normally patent. Veins: Venous phase imaging demonstrates normal patency of venous structures including mesenteric veins, splenic vein, portal vein, IVC, renal veins, iliac veins and common femoral veins. Review of the MIP images confirms the above findings. NON-VASCULAR Lower chest: No acute  abnormality. Hepatobiliary: Evidence of hepatic steatosis. No hepatic masses common biliary dilatation or gallbladder abnormalities. Pancreas: Unremarkable. No pancreatic ductal dilatation or surrounding inflammatory changes. Spleen: No splenic injury or perisplenic hematoma. Adrenals/Urinary Tract: Adrenal glands are unremarkable. Kidneys are normal, without renal calculi, focal lesion, or hydronephrosis. Small bilateral  scattered renal cysts. Bladder is unremarkable. Stomach/Bowel: Bowel shows no evidence of obstruction, ileus, inflammation or visible lesion. There is diverticulosis of the sigmoid, descending and transverse colon without evidence of acute diverticulitis. No evidence active bleeding into the gastrointestinal tract on arterial or venous phases of imaging. No evidence to suggest obvious arteriovenous malformations of bowel or visible pseudoaneurysms. Lymphatic: No enlarged lymph nodes identified. Reproductive: Left-sided uterine fundal mass is consistent with a fibroid and measures up to approximately 4 cm in maximum diameter. Other: No abdominal wall hernia or abnormality. No abdominopelvic ascites. Musculoskeletal: No acute or significant osseous findings. IMPRESSION: 1. No evidence of active bleeding into the gastrointestinal tract by CT angiography. 2. No significant mesenteric arterial occlusive disease or other vascular findings other than mild scattered atherosclerosis. 3. Diverticulosis of the colon without evidence diverticulitis. 4. Hepatic steatosis. 5. Uterine fibroid. Electronically Signed   By: Aletta Edouard M.D.   On: 02/08/2022 12:32   ? ?Colonoscopy 02/08/2022 ?Findings: ?     Four polyps were found in the rectum, descending colon and transverse  ?     colon. The polyps were 2 to 4 mm in size. These polyps were removed with  ?     a cold snare. Resection and retrieval were complete. ?     Many small and large-mouthed diverticula were found in the entire colon.  ?     There was old blood in the colon, but no active bleeding. ?     The exam was otherwise without abnormality on direct and retroflexion  ?     views. ?Impression:               - Four 2 to 4 mm polyps in the rectum, in the  ?                          descending colon and in the transverse colon,  ?                          removed with a cold snare. Resected and retrieved. ?                          - Diverticulosis in the entire examined  colon with  ?                          old blood but no active bleeding. ?                          - The examination was otherwise normal on direct  ?                          and retroflexion views. ? ? Assessment / Plan:   ?Assessment: ?1.  Hematochezia: Onset 02/06/2022 with multiple episodes of painless bright red blood per rectum, on admission she had had a 3 g drop in hemoglobin to 7.6--> 2 units PRBCs--> 10.1--> 9.5--> 9.2, CTA initially with no evidence of  active bleeding, status post colonoscopy yesterday results as above, no further signs of GI bleed overnight and hemoglobin stable; likely diverticular ?2.  Microcytosis ?3.  Hypokalemia ? ?Plan: ?1.  Patient is status post colonoscopy yesterday, discussed findings with her.  Hemoglobin has remained stable overnight. ?2.  Patient would like to stay at least another night to make sure she is doing okay.  She can increase to regular diet this evening. ?3.  Continue to monitor hemoglobin while hospitalized, again if she has acute signs of GI bleed recommend CTA ?4.  We will sign off, please call us back if needed. ? ?Thank you for kind consultation. ? ? ? LOS: 1 day  ? ?Lavone Nian [facility]  02/09/2022, 9:26 AM ? ?GI ATTENDING ? ?Interval history data reviewed.  Patient seen and examined.  Agree with interval progress note as outlined above.  I performed colonoscopy with findings as outlined above.  No further evidence of bleeding she is fatigue due to anemia.  Otherwise stable.  No further plans from GI standpoint.  Advance diet.  Okay for discharge when deemed appropriate by primary service.  No outpatient GI follow-up required.  She will need surveillance colonoscopy years.  I discussed this with her and her daughter.  We will sign off. ? ?Eliani Leclere N. Geri Seminole., M.D. ?[place] ?Division of Gastroenterology  ? ?  ?

## 2022-02-10 DIAGNOSIS — K5731 Diverticulosis of large intestine without perforation or abscess with bleeding: Secondary | ICD-10-CM | POA: Diagnosis not present

## 2022-02-10 DIAGNOSIS — D123 Benign neoplasm of transverse colon: Secondary | ICD-10-CM

## 2022-02-10 DIAGNOSIS — K922 Gastrointestinal hemorrhage, unspecified: Secondary | ICD-10-CM | POA: Diagnosis not present

## 2022-02-10 DIAGNOSIS — K625 Hemorrhage of anus and rectum: Secondary | ICD-10-CM | POA: Diagnosis not present

## 2022-02-10 LAB — CBC
HCT: 29.6 % — ABNORMAL LOW (ref 36.0–46.0)
Hemoglobin: 9.8 g/dL — ABNORMAL LOW (ref 12.0–15.0)
MCH: 26.3 pg (ref 26.0–34.0)
MCHC: 33.1 g/dL (ref 30.0–36.0)
MCV: 79.4 fL — ABNORMAL LOW (ref 80.0–100.0)
Platelets: 236 10*3/uL (ref 150–400)
RBC: 3.73 MIL/uL — ABNORMAL LOW (ref 3.87–5.11)
RDW: 16.6 % — ABNORMAL HIGH (ref 11.5–15.5)
WBC: 7.7 10*3/uL (ref 4.0–10.5)
nRBC: 0 % (ref 0.0–0.2)

## 2022-02-10 LAB — BASIC METABOLIC PANEL
Anion gap: 6 (ref 5–15)
BUN: 8 mg/dL (ref 8–23)
CO2: 28 mmol/L (ref 22–32)
Calcium: 8.7 mg/dL — ABNORMAL LOW (ref 8.9–10.3)
Chloride: 104 mmol/L (ref 98–111)
Creatinine, Ser: 0.71 mg/dL (ref 0.44–1.00)
GFR, Estimated: >60 mL/min (ref >60)
Glucose, Bld: 122 mg/dL — ABNORMAL HIGH (ref 70–99)
Potassium: 3.1 mmol/L — ABNORMAL LOW (ref 3.5–5.1)
Sodium: 138 mmol/L (ref 135–145)

## 2022-02-10 LAB — HEMOGLOBIN AND HEMATOCRIT, BLOOD
HCT: 30.3 % — ABNORMAL LOW (ref 36.0–46.0)
Hemoglobin: 10.2 g/dL — ABNORMAL LOW (ref 12.0–15.0)

## 2022-02-10 LAB — GLUCOSE, CAPILLARY
Glucose-Capillary: 121 mg/dL — ABNORMAL HIGH (ref 70–99)
Glucose-Capillary: 105 mg/dL — ABNORMAL HIGH (ref 70–99)

## 2022-02-10 LAB — POTASSIUM: Potassium: 3.5 mmol/L (ref 3.5–5.1)

## 2022-02-10 MED ORDER — POTASSIUM CHLORIDE 20 MEQ PO PACK
40.0000 meq | PACK | Freq: Once | ORAL | Status: AC
  Administered 2022-02-10: 40 meq via ORAL
  Filled 2022-02-10: qty 2

## 2022-02-10 MED ORDER — CITALOPRAM HYDROBROMIDE 10 MG PO TABS
10.0000 mg | ORAL_TABLET | Freq: Every day | ORAL | 0 refills | Status: AC
Start: 2022-02-11 — End: ?

## 2022-02-10 MED ORDER — CITALOPRAM HYDROBROMIDE 20 MG PO TABS
10.0000 mg | ORAL_TABLET | Freq: Every day | ORAL | Status: DC
Start: 2022-02-10 — End: 2022-02-10
  Administered 2022-02-10: 10 mg via ORAL
  Filled 2022-02-10: qty 1

## 2022-02-10 NOTE — Progress Notes (Signed)
Pt. Discharge via wheelchair, pt. Is alert and oriented family came to bring her home. Discharge instruction and education discussed, patient verbalized understanding. All patient belongings are with the family. ?

## 2022-02-10 NOTE — Plan of Care (Signed)

## 2022-02-10 NOTE — Discharge Summary (Addendum)
?Physician Discharge Summary ?  ?Patient: Christine Porter MRN: [id number] DOB: Apr 23, 1951  ?Admit date:     02/07/2022  ?Discharge date: 02/10/22  ?Discharge Physician: [name]  ? ?PCP: [facility]  ? ?Recommendations at discharge:  ? ?Follow-up PCP in 1 week ?Start taking Celexa 10 mg daily, will need to follow-up with PCP in 1 week to get more refills ? ?Discharge Diagnoses: ?Principal Problem: ?  GI bleed ?Active Problems: ?  Essential hypertension ?  History of asthma ?  Controlled type 2 diabetes mellitus without complication, without long-term current use of insulin ([place]) ?  Hypokalemia ?  Obesity (BMI 30-39.9) ?  Diverticulosis of colon with hemorrhage ?  Benign neoplasm of transverse colon ?  Benign neoplasm of descending colon ?  Benign neoplasm of rectum ? ?Resolved Problems: ?  * No resolved hospital problems. * ? ?Hospital Course: ? ?71 y.o. female with medical history significant of hypertension, diabetes mellitus type 2, asthma presented with rectal bleeding that started yesterday.  Patient continued to have intermittent rectal bleeding with clots with fatigue and increased tiredness.  She denies abdominal pain or vomiting but has some gas and nausea.  Patient took a baby aspirin few days ago but does not take any other blood thinners.  Denies fever, chest pain, worsening shortness breath, dysuria, loss of consciousness, seizures.  She has never had a colonoscopy. ?  ?Assessment and Plan: ? ?Lower GI bleed ?-Presented with lower GI bleed ?-CTA abdominal was obtained which was negative ?-Patient underwent colonoscopy yesterday, which showed diverticulosis in the entire examined colon with old blood and active bleeding.  For 2 to 4 mm polyps in the rectum, in the descending colon and the transverse colon removed with cold snare ?-GI has signed off. ?-As per GI, If polyps adenomatous, repeat colonoscopy in 5 years ?-Surgical pathology shows tubular adenoma, GI to schedule colonoscopy as  outpatient ?-Hemoglobin stable at 10.2 ?  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  ?Hypokalemia ?-replete ?Potassium was 3.1 this am, after replacement potassium  is 3.5  ? ?History of asthma ?-Stable ?-Continue home regimen ?  ?Hypertension ?-Blood pressure is now elevated ?-Restart home medications, amlodipine 10 mg daily ?  ?Diabetes mellitus type 2 ?-Patient was prescribed Glimepiride by her PCP ?-continue home regimen ? ? ?  ? ? ?Consultants:  ?Procedures performed: Colonoscopy ?Disposition: Home ?Diet recommendation:  ?Discharge Diet Orders (From admission, onward)  ? ?  Start     Ordered  ? 02/10/22 0000  Diet - low sodium heart healthy       ? 02/10/22 1230  ? ?  ?  ? ?  ? ?Carb modified diet ?DISCHARGE MEDICATION: ?Allergies as of 02/10/2022   ? ?   Reactions  ? Codeine Nausea And Vomiting  ? Gabapentin Other (See Comments)  ? Headache  ? ?  ? ?  ?Medication List  ?  ? ?STOP taking these medications   ? ?ibuprofen 600 MG tablet ?Commonly known as: ADVIL ?  ?meloxicam 15 MG tablet ?Commonly known as: MOBIC ?  ?predniSONE 5 MG tablet ?Commonly known as: DELTASONE ?  ? ?  ? ?TAKE these medications   ? ?albuterol (2.5 MG/3ML) 0.083% nebulizer solution ?Commonly known as: PROVENTIL ?Take 3 mLs (2.5 mg total) by nebulization every 6 (six) hours as needed for wheezing or shortness of breath. ?  ?  albuterol 108 (90 Base) MCG/ACT inhaler ?Commonly known as: VENTOLIN HFA ?Inhale 1 puff into the lungs every 6 (six) hours as needed for wheezing or shortness of breath. ?  ?allopurinol 100 MG tablet ?Commonly known as: ZYLOPRIM ?Take 100 mg by mouth daily as needed (For gout). ?  ?amLODipine 10 MG tablet ?Commonly known as: NORVASC ?Take 1 tablet by mouth daily. ?  ?budesonide-formoterol 160-4.5 MCG/ACT inhaler ?Commonly known as: Symbicort ?Inhale 2 puffs into the lungs 2 (two) times daily. ?What changed:   ?when to take this ?reasons to take this ?  ?citalopram 10 MG tablet ?Commonly known as: CELEXA ?Take 1 tablet (10 mg total) by mouth daily. ?Start taking on: February 11, 2022 ?  ?Spacer/Aero-Hold [employer] Misc ?Use as directed for inhalation of albuterol and Symbicort. ?  ?triamcinolone cream 0.1 % ?Commonly known as: KENALOG ?Apply 1 application topically 2 (two) times daily. ?What changed:  ?when to take this ?reasons to take this ?  ?Vitamin D (Ergocalciferol) 1.25 MG (50000 UNIT) Caps capsule ?Commonly known as: DRISDOL ?Take 50,000 Units by mouth once a week. ?  ? ?  ? ? Follow-up Information   ? ? [facility] Follow up in 1 week(s).   ?Specialty: Internal Medicine ?Why: Follow-up in 1 week to discuss continuation of Celexa.  You will need more refills ?Contact information: ?[address] ?[place] [address] ?[phone] ? ? ?  ?  ? ?  ?  ? ?  ? ?Discharge Exam: ?Filed Weights  ? 02/07/22 1331 02/08/22 1350  ?Weight: 97.1 kg 97 kg  ? ?General-appears in no acute distress ?Heart-S1-S2, regular, no murmur auscultated ?Lungs-clear to auscultation bilaterally, no wheezing or crackles auscultated ?Abdomen-soft, nontender, no organomegaly ?Extremities-no edema in the lower extremities ?Neuro-alert, oriented x3, no focal deficit noted ? ?Condition at discharge: good ? ?The results of significant diagnostics from this hospitalization (including imaging, microbiology, ancillary and laboratory) are listed below for reference.  ? ?Imaging Studies: ?CT Angio Abd/Pel w/ and/or w/o ? ?Result Date: 02/08/2022 ?CLINICAL DATA:  GI bleed with bright red blood per rectum. EXAM: CT ANGIOGRAPHY ABDOMEN AND PELVIS WITH CONTRAST AND WITHOUT CONTRAST TECHNIQUE: Multidetector CT imaging of the abdomen and pelvis was performed using the standard protocol during bolus administration of intravenous contrast. Multiplanar reconstructed images and MIPs were obtained and reviewed to evaluate the vascular anatomy. RADIATION  DOSE REDUCTION: This exam was performed according to the departmental dose-optimization program which includes automated exposure control, adjustment of the mA and/or kV according to patient size and/or use of iterative reconstruction technique. CONTRAST:  133m OMNIPAQUE IOHEXOL 350 MG/ML SOLN COMPARISON:  Prior CT of the abdomen and pelvis on 01/30/2007 FINDINGS: VASCULAR Aorta: Mild atherosclerosis without evidence of aneurysm, dissection or stenosis. Celiac: Mild calcified plaque at origin without significant stenosis. Normally patent branch vessels and branch vessel anatomy. SMA: Mild atherosclerosis without significant stenosis. Renals: Mild calcified plaque at the origins of bilateral single renal arteries without significant stenosis. IMA: Normally patent. Inflow: Bilateral iliac arteries demonstrate minimal atherosclerosis and normal patency without aneurysm. Proximal Outflow: Common femoral arteries and femoral bifurcations are normally patent. Veins: Venous phase imaging demonstrates normal patency of venous structures including mesenteric veins, splenic vein, portal vein, IVC, renal veins, iliac veins and common femoral veins. Review of the MIP images confirms the above findings. NON-VASCULAR Lower chest: No acute abnormality. Hepatobiliary: Evidence of hepatic steatosis. No hepatic masses common biliary dilatation or gallbladder abnormalities. Pancreas: Unremarkable. No pancreatic ductal dilatation or surrounding inflammatory changes.  Spleen: No splenic injury or perisplenic hematoma. Adrenals/Urinary Tract: Adrenal glands are unremarkable. Kidneys are normal, without renal calculi, focal lesion, or hydronephrosis. Small bilateral scattered renal cysts. Bladder is unremarkable. Stomach/Bowel: Bowel shows no evidence of obstruction, ileus, inflammation or visible lesion. There is diverticulosis of the sigmoid, descending and transverse colon without evidence of acute diverticulitis. No evidence active  bleeding into the gastrointestinal tract on arterial or venous phases of imaging. No evidence to suggest obvious arteriovenous malformations of bowel or visible pseudoaneurysms. Lymphatic: No enlarged lymph nodes

## 2022-02-10 NOTE — Plan of Care (Signed)

## 2022-02-17 NOTE — Progress Notes (Shared)
?[facility] Note ? ?02/20/2022 ? ?  ? ?CHIEF COMPLAINT ?Patient presents for No chief complaint on file. ? ? ?HISTORY OF PRESENT ILLNESS: ?Christine Porter is a 71 y.o. female who presents to the clinic today for:  ? ? ?Pt is here on the referral of Dr. Katy Fitch for concern of retinal dystrophy, pt states she is about to have cataract sx with Dr. Katy Fitch (on March 7), so he wanted her checked out here first, pt also sees Dr. Madilyn Hook for glasses and she was the one who referred pt to Dr. Katy Fitch, pt states she has never been told she has macular degeneration, she states there is no family hx of eye problems, she states she has no prior eye hx, no problems as a child, no problems seeing in the dark, no eye vitamins or supplements, pt endorses having high blood pressure, she takes meloxicam for osteoarthritis, she was tested for RA, but states it was ruled out ? ?Referring physician: ?Nolene Ebbs, MD ?[address] ?[place],  [place] [phone] ? ?HISTORICAL INFORMATION:  ? ?Selected notes from the [place] ?Referred by Dr. Katy Fitch for concern of retinal atrophy ?LEE:  ?Ocular Hx- ?PMH- ?  ? ?CURRENT MEDICATIONS: ?No current outpatient medications on file. (Ophthalmic Drugs)  ? ?No current facility-administered medications for this visit. (Ophthalmic Drugs)  ? ?Current Outpatient Medications (Other)  ?Medication Sig  ? albuterol (PROVENTIL) (2.5 MG/3ML) 0.083% nebulizer solution Take 3 mLs (2.5 mg total) by nebulization every 6 (six) hours as needed for wheezing or shortness of breath.  ? albuterol (VENTOLIN HFA) 108 (90 Base) MCG/ACT inhaler Inhale 1 puff into the lungs every 6 (six) hours as needed for wheezing or shortness of breath.  ? allopurinol (ZYLOPRIM) 100 MG tablet Take 100 mg by mouth daily as needed (For gout).  ? amLODipine (NORVASC) 10 MG tablet Take 1 tablet by mouth daily.  ? budesonide-formoterol (SYMBICORT) 160-4.5 MCG/ACT inhaler Inhale 2 puffs into the lungs 2  (two) times daily. (Patient taking differently: Inhale 2 puffs into the lungs daily as needed (For shortness of breath).)  ? citalopram (CELEXA) 10 MG tablet Take 1 tablet (10 mg total) by mouth daily.  ? Spacer/Aero-Hold [employer] Use as directed for inhalation of albuterol and Symbicort.  ? triamcinolone cream (KENALOG) 0.1 % Apply 1 application topically 2 (two) times daily. (Patient taking differently: Apply 1 application. topically daily as needed (For rash).)  ? Vitamin D, Ergocalciferol, (DRISDOL) 1.25 MG (50000 UNIT) CAPS capsule Take 50,000 Units by mouth once a week.  ? ?No current facility-administered medications for this visit. (Other)  ? ?REVIEW OF SYSTEMS: ? ? ?ALLERGIES ?Allergies  ?Allergen Reactions  ? Codeine Nausea And Vomiting  ? Gabapentin Other (See Comments)  ?  Headache  ? ?PAST MEDICAL HISTORY ?Past Medical History:  ?Diagnosis Date  ? Asthma   ? Gout   ? Hypertension   ? Osteoarthritis   ? Pneumonia   ? ?Past Surgical History:  ?Procedure Laterality Date  ? CESAREAN SECTION    ? COLONOSCOPY WITH PROPOFOL N/A 02/08/2022  ? Procedure: COLONOSCOPY WITH PROPOFOL;  Surgeon: Irene Shipper, MD;  Location: [facility];  Service: Gastroenterology;  Laterality: N/A;  ? POLYPECTOMY  02/08/2022  ? Procedure: POLYPECTOMY;  Surgeon: Irene Shipper, MD;  Location: [facility];  Service: Gastroenterology;;  ? ?FAMILY HISTORY ?Family History  ?Problem Relation Age of Onset  ? Breast cancer Daughter   ? ?SOCIAL HISTORY ?  Social History  ? ?Tobacco Use  ? Smoking status: Never  ? Smokeless tobacco: Never  ?Vaping Use  ? Vaping Use: Never used  ?Substance Use Topics  ? Alcohol use: No  ? Drug use: No  ?  ? ?  ?OPHTHALMIC EXAM: ? ?Not recorded ?  ? ? ?IMAGING AND PROCEDURES  ?Imaging and Procedures for 02/20/2022 ? ? ?  ?  ? ?  ?ASSESSMENT/PLAN: ? ?  ICD-10-CM   ?1. Advanced atrophic nonexudative age-related macular degeneration of both eyes without subfoveal involvement  H35.3133   ?  ?2. Essential  hypertension  I10   ?  ?3. Hypertensive retinopathy of both eyes  H35.033   ?  ?4. Combined forms of age-related cataract of both eyes  H25.813   ?  ? ? ?Age related macular degeneration, non-exudative, both eyes ?- exam and OCT show striking perifoveal ring of atrophy w/ central sparing of ellipsoid signal OU ?- BCVA remains good (20/25 OD, 20/30 OS) ? - The incidence, anatomy, and pathology of dry AMD, risk of progression, and the AREDS and AREDS 2 study including smoking risks discussed with patient. ? - Recommend amsler grid monitoring ? - differential includes macular/pattern dystrophy, central areolar dystrophy, benign concentric annular macular dystrophy or other ? - no CNV or exudative disease noted on FA or exam ? - no retinal or ophthalmic interventions indicated or recommended ? - monitor ?- clear from a retina standpoint to proceed with cataract surgery when pt and surgeon are ready  ? - f/u 6 weeks, DFE, OCT ? ?2,3. Hypertensive retinopathy OU ?- discussed importance of tight BP control ? - monitor ? ?4. Mixed Cataract OU ?- The symptoms of cataract, surgical options, and treatments and risks were discussed with patient. ?- discussed diagnosis and progression ?- under the expert management of Dr. Katy Fitch ?- clear from a retina standpoint to proceed with cataract surgery when pt and surgeon are ready  ?- surgery scheduled for January 31, 2022 ? ? ?Ophthalmic Meds Ordered this visit:  ?No orders of the defined types were placed in this encounter. ?  ? ?No follow-ups on file. ? ?There are no Patient Instructions on file for this visit. ? ? ?Explained the diagnoses, plan, and follow up with the patient and they expressed understanding.  Patient expressed understanding of the importance of proper follow up care.  ? ?This document serves as a record of services personally performed by Gardiner Sleeper, MD, PhD. It was created on their behalf by Roselee Nova, COMT. The creation of this record is the provider's  dictation and/or activities during the visit. ? ?Electronically signed by: Roselee Nova, COMT 02/17/22 10:51 AM ? ? ?Gardiner Sleeper, M.D., Ph.D. ?Diseases & Surgery of the Retina and Vitreous ?[place] ? ? ? ? ?Abbreviations: ?M myopia (nearsighted); A astigmatism; H hyperopia (farsighted); P presbyopia; Mrx spectacle prescription;  CTL contact lenses; OD right eye; OS left eye; OU both eyes  XT exotropia; ET esotropia; PEK punctate epithelial keratitis; PEE punctate epithelial erosions; DES dry eye syndrome; MGD meibomian gland dysfunction; ATs artificial tears; PFAT's preservative free artificial tears; [place] nuclear sclerotic cataract; PSC posterior subcapsular cataract; ERM epi-retinal membrane; PVD posterior vitreous detachment; RD retinal detachment; DM diabetes mellitus; DR diabetic retinopathy; NPDR non-proliferative diabetic retinopathy; PDR proliferative diabetic retinopathy; CSME clinically significant macular edema; DME diabetic macular edema; dbh dot blot hemorrhages; CWS cotton wool spot; POAG primary open angle glaucoma; C/D cup-to-disc ratio; HVF humphrey visual field;  GVF goldmann visual field; OCT optical coherence tomography; IOP intraocular pressure; BRVO Branch retinal vein occlusion; CRVO central retinal vein occlusion; CRAO central retinal artery occlusion; BRAO branch retinal artery occlusion; RT retinal tear; SB scleral buckle; PPV pars plana vitrectomy; VH Vitreous hemorrhage; PRP panretinal laser photocoagulation; IVK intravitreal kenalog; VMT vitreomacular traction; MH Macular hole;  NVD neovascularization of the disc; NVE neovascularization elsewhere; AREDS age related eye disease study; ARMD age related macular degeneration; POAG primary open angle glaucoma; EBMD epithelial/anterior basement membrane dystrophy; ACIOL anterior chamber intraocular lens; IOL intraocular lens; PCIOL posterior chamber intraocular lens; Phaco/IOL phacoemulsification with intraocular  lens placement; [place] photorefractive keratectomy; LASIK laser assisted in situ keratomileusis; HTN hypertension; DM diabetes mellitus; COPD chronic obstructive pulmonary disease ? ?

## 2022-02-20 ENCOUNTER — Encounter (INDEPENDENT_AMBULATORY_CARE_PROVIDER_SITE_OTHER): Payer: Self-pay

## 2022-02-20 ENCOUNTER — Encounter (INDEPENDENT_AMBULATORY_CARE_PROVIDER_SITE_OTHER): Payer: Medicare HMO | Admitting: Ophthalmology

## 2022-02-20 DIAGNOSIS — H353133 Nonexudative age-related macular degeneration, bilateral, advanced atrophic without subfoveal involvement: Secondary | ICD-10-CM

## 2022-02-20 DIAGNOSIS — I10 Essential (primary) hypertension: Secondary | ICD-10-CM

## 2022-02-20 DIAGNOSIS — H35033 Hypertensive retinopathy, bilateral: Secondary | ICD-10-CM

## 2022-02-20 DIAGNOSIS — H25813 Combined forms of age-related cataract, bilateral: Secondary | ICD-10-CM

## 2022-02-20 NOTE — Progress Notes (Shared)
?[facility] Note ? ?02/21/2022 ? ?  ? ?CHIEF COMPLAINT ?Patient presents for No chief complaint on file. ? ? ?HISTORY OF PRESENT ILLNESS: ?Christine Porter is a 71 y.o. female who presents to the clinic today for:  ? ? ?Pt is here on the referral of Dr. Katy Fitch for concern of retinal dystrophy, pt states she is about to have cataract sx with Dr. Katy Fitch (on March 7), so he wanted her checked out here first, pt also sees Dr. Madilyn Hook for glasses and she was the one who referred pt to Dr. Katy Fitch, pt states she has never been told she has macular degeneration, she states there is no family hx of eye problems, she states she has no prior eye hx, no problems as a child, no problems seeing in the dark, no eye vitamins or supplements, pt endorses having high blood pressure, she takes meloxicam for osteoarthritis, she was tested for RA, but states it was ruled out ? ?Referring physician: ?Nolene Ebbs, MD ?[address] ?[place],  [place] [address] ? ?HISTORICAL INFORMATION:  ? ?Selected notes from the [place] ?Referred by Dr. Katy Fitch for concern of retinal atrophy ?LEE:  ?Ocular Hx- ?PMH- ?  ? ?CURRENT MEDICATIONS: ?No current outpatient medications on file. (Ophthalmic Drugs)  ? ?No current facility-administered medications for this visit. (Ophthalmic Drugs)  ? ?Current Outpatient Medications (Other)  ?Medication Sig  ? albuterol (PROVENTIL) (2.5 MG/3ML) 0.083% nebulizer solution Take 3 mLs (2.5 mg total) by nebulization every 6 (six) hours as needed for wheezing or shortness of breath.  ? albuterol (VENTOLIN HFA) 108 (90 Base) MCG/ACT inhaler Inhale 1 puff into the lungs every 6 (six) hours as needed for wheezing or shortness of breath.  ? allopurinol (ZYLOPRIM) 100 MG tablet Take 100 mg by mouth daily as needed (For gout).  ? amLODipine (NORVASC) 10 MG tablet Take 1 tablet by mouth daily.  ? budesonide-formoterol (SYMBICORT) 160-4.5 MCG/ACT inhaler Inhale 2 puffs into the lungs 2  (two) times daily. (Patient taking differently: Inhale 2 puffs into the lungs daily as needed (For shortness of breath).)  ? citalopram (CELEXA) 10 MG tablet Take 1 tablet (10 mg total) by mouth daily.  ? Spacer/Aero-Hold Chamber [employer] Use as directed for inhalation of albuterol and Symbicort.  ? triamcinolone cream (KENALOG) 0.1 % Apply 1 application topically 2 (two) times daily. (Patient taking differently: Apply 1 application. topically daily as needed (For rash).)  ? Vitamin D, Ergocalciferol, (DRISDOL) 1.25 MG (50000 UNIT) CAPS capsule Take 50,000 Units by mouth once a week.  ? ?No current facility-administered medications for this visit. (Other)  ? ?REVIEW OF SYSTEMS: ? ? ?ALLERGIES ?Allergies  ?Allergen Reactions  ? Codeine Nausea And Vomiting  ? Gabapentin Other (See Comments)  ?  Headache  ? ?PAST MEDICAL HISTORY ?Past Medical History:  ?Diagnosis Date  ? Asthma   ? Gout   ? Hypertension   ? Osteoarthritis   ? Pneumonia   ? ?Past Surgical History:  ?Procedure Laterality Date  ? CESAREAN SECTION    ? COLONOSCOPY WITH PROPOFOL N/A 02/08/2022  ? Procedure: COLONOSCOPY WITH PROPOFOL;  Surgeon: Irene Shipper, MD;  Location: [facility];  Service: Gastroenterology;  Laterality: N/A;  ? POLYPECTOMY  02/08/2022  ? Procedure: POLYPECTOMY;  Surgeon: Irene Shipper, MD;  Location: [facility];  Service: Gastroenterology;;  ? ?FAMILY HISTORY ?Family History  ?Problem Relation Age of Onset  ? Breast cancer Daughter   ? ?SOCIAL HISTORY ?  Social History  ? ?Tobacco Use  ? Smoking status: Never  ? Smokeless tobacco: Never  ?Vaping Use  ? Vaping Use: Never used  ?Substance Use Topics  ? Alcohol use: No  ? Drug use: No  ?  ? ?  ?OPHTHALMIC EXAM: ? ?Not recorded ?  ? ? ?IMAGING AND PROCEDURES  ?Imaging and Procedures for 02/21/2022 ? ? ?  ?  ? ?  ?ASSESSMENT/PLAN: ? ?  ICD-10-CM   ?1. Advanced atrophic nonexudative age-related macular degeneration of both eyes without subfoveal involvement  H35.3133   ?  ?2. Essential  hypertension  I10   ?  ?3. Hypertensive retinopathy of both eyes  H35.033   ?  ?4. Combined forms of age-related cataract of both eyes  H25.813   ?  ? ?Age related macular degeneration, non-exudative, both eyes ?- exam and OCT show striking perifoveal ring of atrophy w/ central sparing of ellipsoid signal OU ?- BCVA remains good (20/25 OD, 20/30 OS) ? - The incidence, anatomy, and pathology of dry AMD, risk of progression, and the AREDS and AREDS 2 study including smoking risks discussed with patient. ? - Recommend amsler grid monitoring ? - differential includes macular/pattern dystrophy, central areolar dystrophy, benign concentric annular macular dystrophy or other ? - no CNV or exudative disease noted on FA or exam ? - no retinal or ophthalmic interventions indicated or recommended ? - monitor ?- clear from a retina standpoint to proceed with cataract surgery when pt and surgeon are ready  ? - f/u 6 weeks, DFE, OCT ? ?2,3. Hypertensive retinopathy OU ?- discussed importance of tight BP control ?- monitor ? ?4. Mixed Cataract OU ?- The symptoms of cataract, surgical options, and treatments and risks were discussed with patient. ?- discussed diagnosis and progression ?- under the expert management of Dr. Katy Fitch ?- clear from a retina standpoint to proceed with cataract surgery when pt and surgeon are ready  ?- surgery scheduled for January 31, 2022 ? ? ?Ophthalmic Meds Ordered this visit:  ?No orders of the defined types were placed in this encounter. ?  ? ?No follow-ups on file. ? ?There are no Patient Instructions on file for this visit. ? ? ?Explained the diagnoses, plan, and follow up with the patient and they expressed understanding.  Patient expressed understanding of the importance of proper follow up care.  ? ?This document serves as a record of services personally performed by Gardiner Sleeper, MD, PhD. It was created on their behalf by San Jetty. Owens Shark, OA an ophthalmic technician. The creation of this record  is the provider's dictation and/or activities during the visit.   ? ?Electronically signed by: San Jetty. Owens Shark, [place] 03.27.2023 12:28 PM ? ? ?Gardiner Sleeper, M.D., Ph.D. ?Diseases & Surgery of the Retina and Vitreous ?[place] ? ? ? ? ?Abbreviations: ?M myopia (nearsighted); A astigmatism; H hyperopia (farsighted); P presbyopia; Mrx spectacle prescription;  CTL contact lenses; OD right eye; OS left eye; OU both eyes  XT exotropia; ET esotropia; PEK punctate epithelial keratitis; PEE punctate epithelial erosions; DES dry eye syndrome; MGD meibomian gland dysfunction; ATs artificial tears; PFAT's preservative free artificial tears; [place] nuclear sclerotic cataract; PSC posterior subcapsular cataract; ERM epi-retinal membrane; PVD posterior vitreous detachment; RD retinal detachment; DM diabetes mellitus; DR diabetic retinopathy; NPDR non-proliferative diabetic retinopathy; PDR proliferative diabetic retinopathy; CSME clinically significant macular edema; DME diabetic macular edema; dbh dot blot hemorrhages; CWS cotton wool spot; POAG primary open angle glaucoma; C/D cup-to-disc  ratio; HVF humphrey visual field; GVF goldmann visual field; OCT optical coherence tomography; IOP intraocular pressure; BRVO Branch retinal vein occlusion; CRVO central retinal vein occlusion; CRAO central retinal artery occlusion; BRAO branch retinal artery occlusion; RT retinal tear; SB scleral buckle; PPV pars plana vitrectomy; VH Vitreous hemorrhage; PRP panretinal laser photocoagulation; IVK intravitreal kenalog; VMT vitreomacular traction; MH Macular hole;  NVD neovascularization of the disc; NVE neovascularization elsewhere; AREDS age related eye disease study; ARMD age related macular degeneration; POAG primary open angle glaucoma; EBMD epithelial/anterior basement membrane dystrophy; ACIOL anterior chamber intraocular lens; IOL intraocular lens; PCIOL posterior chamber intraocular lens; Phaco/IOL  phacoemulsification with intraocular lens placement; [place] photorefractive keratectomy; LASIK laser assisted in situ keratomileusis; HTN hypertension; DM diabetes mellitus; COPD chronic obstructive pulmonary disease ? ?

## 2022-02-21 ENCOUNTER — Encounter (INDEPENDENT_AMBULATORY_CARE_PROVIDER_SITE_OTHER): Payer: Self-pay

## 2022-02-21 ENCOUNTER — Encounter (INDEPENDENT_AMBULATORY_CARE_PROVIDER_SITE_OTHER): Payer: Medicare HMO | Admitting: Ophthalmology

## 2022-02-21 DIAGNOSIS — H35033 Hypertensive retinopathy, bilateral: Secondary | ICD-10-CM

## 2022-02-21 DIAGNOSIS — H353133 Nonexudative age-related macular degeneration, bilateral, advanced atrophic without subfoveal involvement: Secondary | ICD-10-CM

## 2022-02-21 DIAGNOSIS — H25813 Combined forms of age-related cataract, bilateral: Secondary | ICD-10-CM

## 2022-02-21 DIAGNOSIS — I10 Essential (primary) hypertension: Secondary | ICD-10-CM

## 2022-02-25 ENCOUNTER — Ambulatory Visit (HOSPITAL_COMMUNITY)
Admission: EM | Admit: 2022-02-25 | Discharge: 2022-02-25 | Disposition: A | Discharge status: Home / Self Care | Payer: Medicare HMO | Attending: Emergency Medicine | Admitting: Emergency Medicine

## 2022-02-25 ENCOUNTER — Encounter (HOSPITAL_COMMUNITY): Payer: Self-pay | Admitting: *Deleted

## 2022-02-25 ENCOUNTER — Other Ambulatory Visit: Payer: Self-pay

## 2022-02-25 DIAGNOSIS — J302 Other seasonal allergic rhinitis: Secondary | ICD-10-CM

## 2022-02-25 DIAGNOSIS — H6982 Other specified disorders of Eustachian tube, left ear: Secondary | ICD-10-CM | POA: Diagnosis not present

## 2022-02-25 DIAGNOSIS — J309 Allergic rhinitis, unspecified: Secondary | ICD-10-CM | POA: Diagnosis not present

## 2022-02-25 DIAGNOSIS — R058 Other specified cough: Secondary | ICD-10-CM

## 2022-02-25 MED ORDER — FEXOFENADINE HCL 180 MG PO TABS: 180.0000 mg | ORAL_TABLET | Freq: Every day | ORAL | 0 refills | Status: DC

## 2022-02-25 MED ORDER — METHYLPREDNISOLONE SODIUM SUCC 125 MG IJ SOLR
125.0000 mg | Freq: Once | INTRAMUSCULAR | Status: AC
  Administered 2022-02-25: 125 mg via INTRAMUSCULAR

## 2022-02-25 MED ORDER — METHYLPREDNISOLONE SODIUM SUCC 125 MG IJ SOLR
INTRAMUSCULAR | Status: AC
  Filled 2022-02-25: qty 2

## 2022-02-25 MED ORDER — FLUTICASONE PROPIONATE 50 MCG/ACT NA SUSP: 1.0000 | Freq: Every day | NASAL | 1 refills | Status: DC

## 2022-02-25 NOTE — ED Triage Notes (Signed)
Pt reports yesterday she had congestion,cough and body aches.  ?

## 2022-02-25 NOTE — Discharge Instructions (Addendum)
Your symptoms and my physical exam findings are concerning for exacerbation of your underlying allergies.  It is important that you are consistent with taking allergy medications exactly as prescribed.   ?  ?Please see the list below for recommended medications, dosages and frequencies to provide relief of your current symptoms:   ?  ?Methylprednisolone IM (Solu-Medrol):  To quickly address your significant respiratory inflammation, you were provided with an injection of methylprednisolone in the office today.  You should continue to feel the full benefit of the steroid for the next 4 to 6 hours.  ?  ?Allegra (fexofenadine): This is an excellent second-generation antihistamine that helps to reduce respiratory inflammatory response to environmental allergens.  This medication is not known to cause daytime sleepiness so it can be taken in the daytime.  If you find that it does make you sleepy, please feel free to take it at bedtime. ?  ?Flonase (fluticasone): This is a steroid nasal spray that you use once daily, 1 spray in each nare.  This medication does not work well if you decide to use it only used as you feel you need to, it works best used on a daily basis.  After 3 to 5 days of use, you will notice significant reduction of the inflammation and mucus production that is currently being caused by exposure to allergens, whether seasonal or environmental.  The most common side effect of this medication is nosebleeds.  If you experience a nosebleed, please discontinue use for 1 week, then feel free to resume.  I have provided you with a prescription but you can also purchase this medication over-the-counter if your insurance will not cover it. ? ?Please continue using your Symbicort, 2 puffs twice daily and your albuterol 2 puffs every time you feel short of breath or cough. ? ?Please keep in mind, not keeping your allergies well controlled can increase your risk of more frequent upper respiratory infections that  may or may not require the use of antibiotics, serious exacerbations that require the use of oral steroids, loss of time at work and missed social opportunities. ?  ?If you find that you have not had significant relief of your symptoms in the next 3 to 5 days, please follow-up with your primary care provider or return to urgent care for repeat evaluation. ?  ?Thank you for visiting urgent care today.  It was a pleasure seeing you again.  We appreciate the opportunity to participate in your care. ? ?

## 2022-02-25 NOTE — ED Provider Notes (Signed)
?[facility] ? ? ? ?CSN: [id number] ?Arrival date & time: 02/25/22  1159 ?  ? ?HISTORY  ? ?Chief Complaint  ?Patient presents with  ? Cough  ? Nasal Congestion  ? Muscle Pain  ? ?HPI ?Christine Porter is a 71 y.o. female. Pt reports yesterday she had congestion,cough and body aches.  Patient states symptoms today are similar to the symptoms she had when she was seen in urgent care in September of last year.  At that time patient was diagnosed with a COPD exacerbation and advised to begin albuterol and Symbicort.  Patient states she is currently using both at this time but feels that they are not enough for her current symptoms.  Patient states she is not currently using any allergy medications. ? ?The history is provided by the patient.  ?Past Medical History:  ?Diagnosis Date  ? Asthma   ? Gout   ? Hypertension   ? Osteoarthritis   ? Pneumonia   ? ?Patient Active Problem List  ? Diagnosis Date Noted  ? Diverticulosis of colon with hemorrhage   ? Benign neoplasm of transverse colon   ? Benign neoplasm of descending colon   ? Benign neoplasm of rectum   ? GI bleed 02/07/2022  ? Controlled type 2 diabetes mellitus without complication, without long-term current use of insulin ([place]) 02/07/2022  ? Hypokalemia 02/07/2022  ? Obesity (BMI 30-39.9) 02/07/2022  ? DDD (degenerative disc disease), cervical 06/24/2020  ? History of peripheral neuropathy 06/24/2020  ? Essential hypertension 06/24/2020  ? History of asthma 06/24/2020  ? Gouty arthritis 06/24/2020  ? ?Past Surgical History:  ?Procedure Laterality Date  ? CESAREAN SECTION    ? COLONOSCOPY WITH PROPOFOL N/A 02/08/2022  ? Procedure: COLONOSCOPY WITH PROPOFOL;  Surgeon: Irene Shipper, MD;  Location: [facility];  Service: Gastroenterology;  Laterality: N/A;  ? POLYPECTOMY  02/08/2022  ? Procedure: POLYPECTOMY;  Surgeon: Irene Shipper, MD;  Location: [facility];  Service: Gastroenterology;;  ? ?OB History   ?No obstetric history on file. ?  ? ?Home  Medications   ? ?Prior to Admission medications   ?Medication Sig Start Date End Date Taking? Authorizing Provider  ?albuterol (PROVENTIL) (2.5 MG/3ML) 0.083% nebulizer solution Take 3 mLs (2.5 mg total) by nebulization every 6 (six) hours as needed for wheezing or shortness of breath. 11/06/18   Melynda Ripple, MD  ?albuterol (VENTOLIN HFA) 108 (90 Base) MCG/ACT inhaler Inhale 1 puff into the lungs every 6 (six) hours as needed for wheezing or shortness of breath. 08/10/21 02/07/22  Lynden Oxford Scales, PA-C  ?allopurinol (ZYLOPRIM) 100 MG tablet Take 100 mg by mouth daily as needed (For gout). 10/30/18   [provider]  ?amLODipine (NORVASC) 10 MG tablet Take 1 tablet by mouth daily. 03/24/21   [provider]  ?budesonide-formoterol (SYMBICORT) 160-4.5 MCG/ACT inhaler Inhale 2 puffs into the lungs 2 (two) times daily. ?Patient taking differently: Inhale 2 puffs into the lungs daily as needed (For shortness of breath). 08/10/21   Lynden Oxford Scales, PA-C  ?citalopram (CELEXA) 10 MG tablet Take 1 tablet (10 mg total) by mouth daily. 02/11/22   [name], MD  ?Spacer/Aero-Hold Chamber [employer] Use as directed for inhalation of albuterol and Symbicort. 08/10/21   Lynden Oxford Scales, PA-C  ?triamcinolone cream (KENALOG) 0.1 % Apply 1 application topically 2 (two) times daily. ?Patient taking differently: Apply 1 application. topically daily as needed (For rash). 08/17/18   Wieters, Elesa Hacker, PA-C  ?Vitamin D,  Ergocalciferol, (DRISDOL) 1.25 MG (50000 UNIT) CAPS capsule Take 50,000 Units by mouth once a week. 02/18/20   [provider]  ? ?Family History ?Family History  ?Problem Relation Age of Onset  ? Breast cancer Daughter   ? ?Social History ?Social History  ? ?Tobacco Use  ? Smoking status: Never  ? Smokeless tobacco: Never  ?Vaping Use  ? Vaping Use: Never used  ?Substance Use Topics  ? Alcohol use: No  ? Drug use: No  ? ?Allergies   ?Codeine and Gabapentin ? ?Review of  Systems ?Review of Systems ?Pertinent findings noted in history of present illness.  ? ?Physical Exam ?Triage Vital Signs ?ED Triage Vitals  ?Enc Vitals Group  ?   BP 09/23/21 0827 (!) 147/82  ?   Pulse Rate 09/23/21 0827 72  ?   Resp 09/23/21 0827 18  ?   Temp 09/23/21 0827 98.3 ?F (36.8 ?C)  ?   Temp Source 09/23/21 0827 Oral  ?   SpO2 09/23/21 0827 98 %  ?   Weight --   ?   Height --   ?   Head Circumference --   ?   Peak Flow --   ?   Pain Score 09/23/21 0826 5  ?   Pain Loc --   ?   Pain Edu? --   ?   Excl. in [place]? --   ?No data found. ? ?Updated Vital Signs ?BP 131/86   Pulse 84   Temp 98.1 ?F (36.7 ?C)   Resp 18   SpO2 97%  ? ?Physical Exam ?Vitals and nursing note reviewed.  ?Constitutional:   ?   General: She is not in acute distress. ?   Appearance: Normal appearance. She is not ill-appearing.  ?HENT:  ?   Head: Normocephalic and atraumatic.  ?   Salivary Glands: Right salivary gland is not diffusely enlarged or tender. Left salivary gland is not diffusely enlarged or tender.  ?   Right Ear: Ear canal and external ear normal. No drainage. A middle ear effusion is present. There is no impacted cerumen. Tympanic membrane is bulging. Tympanic membrane is not injected or erythematous.  ?   Left Ear: Ear canal and external ear normal. No drainage. A middle ear effusion is present. There is no impacted cerumen. Tympanic membrane is bulging. Tympanic membrane is not injected or erythematous.  ?   Ears:  ?   Comments: Bilateral EACs normal, both TMs bulging with clear fluid ?   Nose: Rhinorrhea present. No nasal deformity, septal deviation, signs of injury, nasal tenderness, mucosal edema or congestion. Rhinorrhea is clear.  ?   Right Nostril: Occlusion present. No foreign body, epistaxis or septal hematoma.  ?   Left Nostril: Occlusion present. No foreign body, epistaxis or septal hematoma.  ?   Right Turbinates: Enlarged, swollen and pale.  ?   Left Turbinates: Enlarged, swollen and pale.  ?   Right Sinus: No  maxillary sinus tenderness or frontal sinus tenderness.  ?   Left Sinus: No maxillary sinus tenderness or frontal sinus tenderness.  ?   Mouth/Throat:  ?   Lips: Pink. No lesions.  ?   Mouth: Mucous membranes are moist. No oral lesions.  ?   Pharynx: Oropharynx is clear. Uvula midline. No posterior oropharyngeal erythema or uvula swelling.  ?   Tonsils: No tonsillar exudate. 0 on the right. 0 on the left.  ?   Comments: Postnasal drip ?Eyes:  ?   General:  Lids are normal.     ?   Right eye: No discharge.     ?   Left eye: No discharge.  ?   Extraocular Movements: Extraocular movements intact.  ?   Conjunctiva/sclera: Conjunctivae normal.  ?   Right eye: Right conjunctiva is not injected.  ?   Left eye: Left conjunctiva is not injected.  ?Neck:  ?   Trachea: Trachea and phonation normal.  ?Cardiovascular:  ?   Rate and Rhythm: Normal rate and regular rhythm.  ?   Pulses: Normal pulses.  ?   Heart sounds: Normal heart sounds. No murmur heard. ?  No friction rub. No gallop.  ?Pulmonary:  ?   Effort: Pulmonary effort is normal. No accessory muscle usage, prolonged expiration or respiratory distress.  ?   Breath sounds: Normal breath sounds. No stridor, decreased air movement or transmitted upper airway sounds. No decreased breath sounds, wheezing, rhonchi or rales.  ?Chest:  ?   Chest wall: No tenderness.  ?Musculoskeletal:     ?   General: Normal range of motion.  ?   Cervical back: Normal range of motion and neck supple. Normal range of motion.  ?Lymphadenopathy:  ?   Cervical: No cervical adenopathy.  ?Skin: ?   General: Skin is warm and dry.  ?   Findings: No erythema or rash.  ?Neurological:  ?   General: No focal deficit present.  ?   Mental Status: She is alert and oriented to person, place, and time.  ?Psychiatric:     ?   Mood and Affect: Mood normal.     ?   Behavior: Behavior normal.  ? ? ?Visual Acuity ?Right Eye Distance:   ?Left Eye Distance:   ?Bilateral Distance:   ? ?Right Eye Near:   ?Left Eye Near:     ?Bilateral Near:    ? ?[facility] Couse / Diagnostics / Procedures:  ?  ?EKG ? ?Radiology ?No results found. ? ?Procedures ?Procedures (including critical care time) ? ?UC Diagnoses / Final Clinical Impressions(s)   ?I have r

## 2022-02-27 NOTE — Progress Notes (Deleted)
?[facility] Note ? ?02/28/2022 ? ?  ? ?CHIEF COMPLAINT ?Patient presents for Retina Follow Up ? ? ?HISTORY OF PRESENT ILLNESS: ?Christine Porter is a 71 y.o. female who presents to the clinic today for:  ? ?HPI   ? ? Retina Follow Up   ? ?      ? Diagnosis: Dry AMD  ? Laterality: both eyes  ? ?  ?  ?Last edited by Roselee Nova D, COT on 02/28/2022 12:59 PM.  ?  ? ?Pt  ? ?Referring physician: ?Nolene Ebbs, MD ?[address] ?[place],  [place] [address] ? ?HISTORICAL INFORMATION:  ? ?Selected notes from the [facility] ?Referred by Dr. Katy Fitch for concern of retinal atrophy ?LEE:  ?Ocular Hx- ?PMH- ?  ? ?CURRENT MEDICATIONS: ?No current outpatient medications on file. (Ophthalmic Drugs)  ? ?No current facility-administered medications for this visit. (Ophthalmic Drugs)  ? ?Current Outpatient Medications (Other)  ?Medication Sig  ? albuterol (PROVENTIL) (2.5 MG/3ML) 0.083% nebulizer solution Take 3 mLs (2.5 mg total) by nebulization every 6 (six) hours as needed for wheezing or shortness of breath.  ? albuterol (VENTOLIN HFA) 108 (90 Base) MCG/ACT inhaler Inhale 1 puff into the lungs every 6 (six) hours as needed for wheezing or shortness of breath.  ? allopurinol (ZYLOPRIM) 100 MG tablet Take 100 mg by mouth daily as needed (For gout).  ? amLODipine (NORVASC) 10 MG tablet Take 1 tablet by mouth daily.  ? budesonide-formoterol (SYMBICORT) 160-4.5 MCG/ACT inhaler Inhale 2 puffs into the lungs 2 (two) times daily. (Patient taking differently: Inhale 2 puffs into the lungs daily as needed (For shortness of breath).)  ? citalopram (CELEXA) 10 MG tablet Take 1 tablet (10 mg total) by mouth daily.  ? fexofenadine (ALLEGRA) 180 MG tablet Take 1 tablet (180 mg total) by mouth daily.  ? fluticasone (FLONASE) 50 MCG/ACT nasal spray Place 1 spray into both nostrils daily. Begin by using 2 sprays in each nare daily for 3 to 5 days, then decrease to 1 spray in each nare daily.  ? Spacer/Aero-Hold  Chamber [employer] Use as directed for inhalation of albuterol and Symbicort.  ? triamcinolone cream (KENALOG) 0.1 % Apply 1 application topically 2 (two) times daily. (Patient taking differently: Apply 1 application. topically daily as needed (For rash).)  ? Vitamin D, Ergocalciferol, (DRISDOL) 1.25 MG (50000 UNIT) CAPS capsule Take 50,000 Units by mouth once a week.  ? ?No current facility-administered medications for this visit. (Other)  ? ?REVIEW OF SYSTEMS: ? ? ?ALLERGIES ?Allergies  ?Allergen Reactions  ? Codeine Nausea And Vomiting  ? Gabapentin Other (See Comments)  ?  Headache  ? ?PAST MEDICAL HISTORY ?Past Medical History:  ?Diagnosis Date  ? Asthma   ? Gout   ? Hypertension   ? Osteoarthritis   ? Pneumonia   ? ?Past Surgical History:  ?Procedure Laterality Date  ? CESAREAN SECTION    ? COLONOSCOPY WITH PROPOFOL N/A 02/08/2022  ? Procedure: COLONOSCOPY WITH PROPOFOL;  Surgeon: Irene Shipper, MD;  Location: [facility];  Service: Gastroenterology;  Laterality: N/A;  ? POLYPECTOMY  02/08/2022  ? Procedure: POLYPECTOMY;  Surgeon: Irene Shipper, MD;  Location: [facility];  Service: Gastroenterology;;  ? ?FAMILY HISTORY ?Family History  ?Problem Relation Age of Onset  ? Breast cancer Daughter   ? ?SOCIAL HISTORY ?Social History  ? ?Tobacco Use  ? Smoking status: Never  ? Smokeless tobacco: Never  ?Vaping Use  ? Vaping Use:  Never used  ?Substance Use Topics  ? Alcohol use: No  ? Drug use: No  ?  ? ?  ?OPHTHALMIC EXAM: ? ?Not recorded ?  ? ? ?IMAGING AND PROCEDURES  ?Imaging and Procedures for 02/28/2022 ? ? ?  ?  ? ?  ?ASSESSMENT/PLAN: ? ?  ICD-10-CM   ?1. Advanced atrophic nonexudative age-related macular degeneration of both eyes without subfoveal involvement  H35.3133 OCT, Retina - OU - Both Eyes  ?  ?2. Essential hypertension  I10   ?  ?3. Hypertensive retinopathy of both eyes  H35.033   ?  ?4. Combined forms of age-related cataract of both eyes  H25.813   ?  ? ?Age related macular degeneration, non-exudative,  both eyes ?- exam and OCT show striking perifoveal ring of atrophy w/ central sparing of ellipsoid signal OU ?- BCVA remains good (20/25 OD, 20/30 OS) ? - The incidence, anatomy, and pathology of dry AMD, risk of progression, and the AREDS and AREDS 2 study including smoking risks discussed with patient. ? - Recommend amsler grid monitoring ? - differential includes macular/pattern dystrophy, central areolar dystrophy, benign concentric annular macular dystrophy or other ? - no CNV or exudative disease noted on FA or exam ? - no retinal or ophthalmic interventions indicated or recommended ? - monitor ?- clear from a retina standpoint to proceed with cataract surgery when pt and surgeon are ready  ? - f/u 6 weeks, DFE, OCT ? ?2,3. Hypertensive retinopathy OU ?- discussed importance of tight BP control ?- monitor ? ?4. Mixed Cataract OU ?- The symptoms of cataract, surgical options, and treatments and risks were discussed with patient. ?- discussed diagnosis and progression ?- under the expert management of Dr. Katy Fitch ?- clear from a retina standpoint to proceed with cataract surgery when pt and surgeon are ready  ?- surgery scheduled for January 31, 2022 ? ? ?Ophthalmic Meds Ordered this visit:  ?No orders of the defined types were placed in this encounter. ?  ? ?No follow-ups on file. ? ?There are no Patient Instructions on file for this visit. ? ? ?Explained the diagnoses, plan, and follow up with the patient and they expressed understanding.  Patient expressed understanding of the importance of proper follow up care.  ? ?This document serves as a record of services personally performed by Gardiner Sleeper, MD, PhD. It was created on their behalf by Roselee Nova, COMT. The creation of this record is the provider's dictation and/or activities during the visit. ? ?This document serves as a record of services personally performed by Gardiner Sleeper, MD, PhD. It was created on their behalf by San Jetty. Owens Shark, OA an  ophthalmic technician. The creation of this record is the provider's dictation and/or activities during the visit.   ? ?Electronically signed by: San Jetty. Owens Shark, [place] 04.03.2023 1:02 PM ? ? ?Gardiner Sleeper, M.D., Ph.D. ?Diseases & Surgery of the Retina and Vitreous ?[place] ? ? ? ? ?Abbreviations: ?M myopia (nearsighted); A astigmatism; H hyperopia (farsighted); P presbyopia; Mrx spectacle prescription;  CTL contact lenses; OD right eye; OS left eye; OU both eyes  XT exotropia; ET esotropia; PEK punctate epithelial keratitis; PEE punctate epithelial erosions; DES dry eye syndrome; MGD meibomian gland dysfunction; ATs artificial tears; PFAT's preservative free artificial tears; [place] nuclear sclerotic cataract; PSC posterior subcapsular cataract; ERM epi-retinal membrane; PVD posterior vitreous detachment; RD retinal detachment; DM diabetes mellitus; DR diabetic retinopathy; NPDR non-proliferative diabetic retinopathy; PDR proliferative diabetic retinopathy;  CSME clinically significant macular edema; DME diabetic macular edema; dbh dot blot hemorrhages; CWS cotton wool spot; POAG primary open angle glaucoma; C/D cup-to-disc ratio; HVF humphrey visual field; GVF goldmann visual field; OCT optical coherence tomography; IOP intraocular pressure; BRVO Branch retinal vein occlusion; CRVO central retinal vein occlusion; CRAO central retinal artery occlusion; BRAO branch retinal artery occlusion; RT retinal tear; SB scleral buckle; PPV pars plana vitrectomy; VH Vitreous hemorrhage; PRP panretinal laser photocoagulation; IVK intravitreal kenalog; VMT vitreomacular traction; MH Macular hole;  NVD neovascularization of the disc; NVE neovascularization elsewhere; AREDS age related eye disease study; ARMD age related macular degeneration; POAG primary open angle glaucoma; EBMD epithelial/anterior basement membrane dystrophy; ACIOL anterior chamber intraocular lens; IOL intraocular lens; PCIOL posterior  chamber intraocular lens; Phaco/IOL phacoemulsification with intraocular lens placement; [place] photorefractive keratectomy; LASIK laser assisted in situ keratomileusis; HTN hypertension; DM diabetes mellitus;

## 2022-02-28 ENCOUNTER — Encounter (INDEPENDENT_AMBULATORY_CARE_PROVIDER_SITE_OTHER): Payer: Medicare HMO | Admitting: Ophthalmology

## 2022-02-28 ENCOUNTER — Encounter (INDEPENDENT_AMBULATORY_CARE_PROVIDER_SITE_OTHER): Payer: Self-pay

## 2022-02-28 DIAGNOSIS — I10 Essential (primary) hypertension: Secondary | ICD-10-CM

## 2022-02-28 DIAGNOSIS — H353133 Nonexudative age-related macular degeneration, bilateral, advanced atrophic without subfoveal involvement: Secondary | ICD-10-CM

## 2022-02-28 DIAGNOSIS — H25813 Combined forms of age-related cataract, bilateral: Secondary | ICD-10-CM

## 2022-02-28 DIAGNOSIS — H35033 Hypertensive retinopathy, bilateral: Secondary | ICD-10-CM

## 2022-03-03 NOTE — Progress Notes (Signed)
This encounter was created in error - please disregard.

## 2022-03-22 NOTE — Progress Notes (Shared)
?[facility] Note ? ?04/04/2022 ? ?  ? ?CHIEF COMPLAINT ?Patient presents for No chief complaint on file. ? ? ?HISTORY OF PRESENT ILLNESS: ?Christine Porter is a 71 y.o. female who presents to the clinic today for:  ? ? ?Pt is here on the referral of Dr. Katy Fitch for concern of retinal dystrophy, pt states she is about to have cataract sx with Dr. Katy Fitch (on March 7), so he wanted her checked out here first, pt also sees Dr. Madilyn Hook for glasses and she was the one who referred pt to Dr. Katy Fitch, pt states she has never been told she has macular degeneration, she states there is no family hx of eye problems, she states she has no prior eye hx, no problems as a child, no problems seeing in the dark, no eye vitamins or supplements, pt endorses having high blood pressure, she takes meloxicam for osteoarthritis, she was tested for RA, but states it was ruled out ? ?Referring physician: ?[facility] ?[place],  [place] [address] ? ?HISTORICAL INFORMATION:  ? ?Selected notes from the [facility] ?Referred by Dr. Katy Fitch for concern of retinal atrophy ?LEE:  ?Ocular Hx- ?PMH- ?  ? ?CURRENT MEDICATIONS: ?No current outpatient medications on file. (Ophthalmic Drugs)  ? ?No current facility-administered medications for this visit. (Ophthalmic Drugs)  ? ?Current Outpatient Medications (Other)  ?Medication Sig  ? albuterol (PROVENTIL) (2.5 MG/3ML) 0.083% nebulizer solution Take 3 mLs (2.5 mg total) by nebulization every 6 (six) hours as needed for wheezing or shortness of breath.  ? albuterol (VENTOLIN HFA) 108 (90 Base) MCG/ACT inhaler Inhale 1 puff into the lungs every 6 (six) hours as needed for wheezing or shortness of breath.  ? allopurinol (ZYLOPRIM) 100 MG tablet Take 100 mg by mouth daily as needed (For gout).  ? amLODipine (NORVASC) 10 MG tablet Take 1 tablet by mouth daily.  ? budesonide-formoterol (SYMBICORT) 160-4.5 MCG/ACT inhaler Inhale 2 puffs into the lungs 2  (two) times daily. (Patient taking differently: Inhale 2 puffs into the lungs daily as needed (For shortness of breath).)  ? citalopram (CELEXA) 10 MG tablet Take 1 tablet (10 mg total) by mouth daily.  ? fexofenadine (ALLEGRA) 180 MG tablet Take 1 tablet (180 mg total) by mouth daily.  ? fluticasone (FLONASE) 50 MCG/ACT nasal spray Place 1 spray into both nostrils daily. Begin by using 2 sprays in each nare daily for 3 to 5 days, then decrease to 1 spray in each nare daily.  ? Spacer/Aero-Hold Chamber [employer] Use as directed for inhalation of albuterol and Symbicort.  ? triamcinolone cream (KENALOG) 0.1 % Apply 1 application topically 2 (two) times daily. (Patient taking differently: Apply 1 application. topically daily as needed (For rash).)  ? Vitamin D, Ergocalciferol, (DRISDOL) 1.25 MG (50000 UNIT) CAPS capsule Take 50,000 Units by mouth once a week.  ? ?No current facility-administered medications for this visit. (Other)  ? ?REVIEW OF SYSTEMS: ? ? ?ALLERGIES ?Allergies  ?Allergen Reactions  ? Codeine Nausea And Vomiting  ? Gabapentin Other (See Comments)  ?  Headache  ? ?PAST MEDICAL HISTORY ?Past Medical History:  ?Diagnosis Date  ? Asthma   ? Gout   ? Hypertension   ? Osteoarthritis   ? Pneumonia   ? ?Past Surgical History:  ?Procedure Laterality Date  ? CESAREAN SECTION    ? COLONOSCOPY WITH PROPOFOL N/A 02/08/2022  ? Procedure: COLONOSCOPY WITH PROPOFOL;  Surgeon: Irene Shipper, MD;  Location: [facility];  Service: Gastroenterology;  Laterality: N/A;  ? POLYPECTOMY  02/08/2022  ? Procedure: POLYPECTOMY;  Surgeon: Irene Shipper, MD;  Location: [facility];  Service: Gastroenterology;;  ? ?FAMILY HISTORY ?Family History  ?Problem Relation Age of Onset  ? Breast cancer Daughter   ? ?SOCIAL HISTORY ?Social History  ? ?Tobacco Use  ? Smoking status: Never  ? Smokeless tobacco: Never  ?Vaping Use  ? Vaping Use: Never used  ?Substance Use Topics  ? Alcohol use: No  ? Drug use: No  ?  ? ?  ?OPHTHALMIC  EXAM: ? ?Not recorded ?  ? ? ?IMAGING AND PROCEDURES  ?Imaging and Procedures for 04/04/2022 ? ? ?  ?  ? ?  ?ASSESSMENT/PLAN: ? ?  ICD-10-CM   ?1. Advanced atrophic nonexudative age-related macular degeneration of both eyes without subfoveal involvement  H35.3133   ?  ?2. Essential hypertension  I10   ?  ?3. Hypertensive retinopathy of both eyes  H35.033   ?  ?4. Combined forms of age-related cataract of both eyes  H25.813   ?  ? ? ?Age related macular degeneration, non-exudative, both eyes ?- exam and OCT show striking perifoveal ring of atrophy w/ central sparing of ellipsoid signal OU ?- BCVA remains good (20/25 OD, 20/30 OS) ? - The incidence, anatomy, and pathology of dry AMD, risk of progression, and the AREDS and AREDS 2 study including smoking risks discussed with patient. ? - Recommend amsler grid monitoring ? - differential includes macular/pattern dystrophy, central areolar dystrophy, benign concentric annular macular dystrophy or other ? - no CNV or exudative disease noted on FA or exam ? - no retinal or ophthalmic interventions indicated or recommended ? - monitor ?- clear from a retina standpoint to proceed with cataract surgery when pt and surgeon are ready  ? - f/u 6 weeks, DFE, OCT ? ?2,3. Hypertensive retinopathy OU ?- discussed importance of tight BP control ?- monitor ? ?4. Mixed Cataract OU ?- The symptoms of cataract, surgical options, and treatments and risks were discussed with patient. ?- discussed diagnosis and progression ?- under the expert management of Dr. Katy Fitch ?- clear from a retina standpoint to proceed with cataract surgery when pt and surgeon are ready  ?- Surgery was scheduled for February 20, 2022 ? ? ?Ophthalmic Meds Ordered this visit:  ?No orders of the defined types were placed in this encounter. ?  ? ?No follow-ups on file. ? ?There are no Patient Instructions on file for this visit. ? ? ?Explained the diagnoses, plan, and follow up with the patient and they expressed  understanding.  Patient expressed understanding of the importance of proper follow up care.  ? ?This document serves as a record of services personally performed by Gardiner Sleeper, MD, PhD. It was created on their behalf by Annie Paras, COT, an ophthalmic technician. The creation of this record is the provider's dictation and/or activities during the visit.   ? ?Electronically signed by: Annie Paras, COT 03/22/22 10:36 AM  ? ?Gardiner Sleeper, M.D., Ph.D. ?Diseases & Surgery of the Retina and Vitreous ?[place] ? ? ? ?Abbreviations: ?M myopia (nearsighted); A astigmatism; H hyperopia (farsighted); P presbyopia; Mrx spectacle prescription;  CTL contact lenses; OD right eye; OS left eye; OU both eyes  XT exotropia; ET esotropia; PEK punctate epithelial keratitis; PEE punctate epithelial erosions; DES dry eye syndrome; MGD meibomian gland dysfunction; ATs artificial tears; PFAT's preservative free artificial tears; [place] nuclear  sclerotic cataract; PSC posterior subcapsular cataract; ERM epi-retinal membrane; PVD posterior vitreous detachment; RD retinal detachment; DM diabetes mellitus; DR diabetic retinopathy; NPDR non-proliferative diabetic retinopathy; PDR proliferative diabetic retinopathy; CSME clinically significant macular edema; DME diabetic macular edema; dbh dot blot hemorrhages; CWS cotton wool spot; POAG primary open angle glaucoma; C/D cup-to-disc ratio; HVF humphrey visual field; GVF goldmann visual field; OCT optical coherence tomography; IOP intraocular pressure; BRVO Branch retinal vein occlusion; CRVO central retinal vein occlusion; CRAO central retinal artery occlusion; BRAO branch retinal artery occlusion; RT retinal tear; SB scleral buckle; PPV pars plana vitrectomy; VH Vitreous hemorrhage; PRP panretinal laser photocoagulation; IVK intravitreal kenalog; VMT vitreomacular traction; MH Macular hole;  NVD neovascularization of the disc; NVE neovascularization  elsewhere; AREDS age related eye disease study; ARMD age related macular degeneration; POAG primary open angle glaucoma; EBMD epithelial/anterior basement membrane dystrophy; ACIOL anterior chamber intraocular lens; IOL i

## 2022-04-04 ENCOUNTER — Encounter (INDEPENDENT_AMBULATORY_CARE_PROVIDER_SITE_OTHER): Payer: Medicare HMO | Admitting: Ophthalmology

## 2022-04-04 ENCOUNTER — Encounter (INDEPENDENT_AMBULATORY_CARE_PROVIDER_SITE_OTHER): Payer: Self-pay

## 2022-04-04 DIAGNOSIS — H35033 Hypertensive retinopathy, bilateral: Secondary | ICD-10-CM

## 2022-04-04 DIAGNOSIS — I10 Essential (primary) hypertension: Secondary | ICD-10-CM

## 2022-04-04 DIAGNOSIS — H353133 Nonexudative age-related macular degeneration, bilateral, advanced atrophic without subfoveal involvement: Secondary | ICD-10-CM

## 2022-04-04 DIAGNOSIS — H25813 Combined forms of age-related cataract, bilateral: Secondary | ICD-10-CM

## 2022-11-07 ENCOUNTER — Ambulatory Visit (INDEPENDENT_AMBULATORY_CARE_PROVIDER_SITE_OTHER): Payer: Medicare HMO

## 2022-11-07 ENCOUNTER — Ambulatory Visit: Payer: Self-pay

## 2022-11-07 ENCOUNTER — Ambulatory Visit (HOSPITAL_COMMUNITY)
Admission: RE | Admit: 2022-11-07 | Discharge: 2022-11-07 | Disposition: A | Discharge status: Home / Self Care | Payer: Medicare HMO | Source: Ambulatory Visit

## 2022-11-07 ENCOUNTER — Encounter (HOSPITAL_COMMUNITY): Payer: Self-pay

## 2022-11-07 VITALS — BP 147/86 | HR 94 | Temp 98.2°F | Resp 18

## 2022-11-07 DIAGNOSIS — M25471 Effusion, right ankle: Secondary | ICD-10-CM | POA: Diagnosis not present

## 2022-11-07 DIAGNOSIS — M25571 Pain in right ankle and joints of right foot: Secondary | ICD-10-CM

## 2022-11-07 MED ORDER — KETOROLAC TROMETHAMINE 60 MG/2ML IM SOLN
30.0000 mg | Freq: Once | INTRAMUSCULAR | Status: AC
  Administered 2022-11-07: 30 mg via INTRAMUSCULAR

## 2022-11-07 MED ORDER — KETOROLAC TROMETHAMINE 30 MG/ML IJ SOLN
INTRAMUSCULAR | Status: AC
Start: 2022-11-07 — End: ?
  Filled 2022-11-07: qty 1

## 2022-11-07 NOTE — ED Provider Notes (Signed)
[place]    CSN: [id number] Arrival date & time: 11/07/22  3704     History   Chief Complaint Chief Complaint  Patient presents with   Ankle Pain    Right ankle swelled sore possible fracture - Entered by patient    HPI Christine Porter is a 71 y.o. female.  Presents with several week history of right ankle pain, swelling 6/10 ache today Fell and twisted the ankle a week ago but does not think this is related. Daughter was worried about a fracture History of gout, reports she gets in the ankle sometimes. Takes daily allopurinol  Also takes percocet  Has been on her feet a lot recently No pain or swelling in the calf.   Past Medical History:  Diagnosis Date   Asthma    Gout    Hypertension    Osteoarthritis    Pneumonia     Patient Active Problem List   Diagnosis Date Noted   Diverticulosis of colon with hemorrhage    Benign neoplasm of transverse colon    Benign neoplasm of descending colon    Benign neoplasm of rectum    GI bleed 02/07/2022   Controlled type 2 diabetes mellitus without complication, without long-term current use of insulin ([facility]) 02/07/2022   Hypokalemia 02/07/2022   Obesity (BMI 30-39.9) 02/07/2022   DDD (degenerative disc disease), cervical 06/24/2020   History of peripheral neuropathy 06/24/2020   Essential hypertension 06/24/2020   History of asthma 06/24/2020   Gouty arthritis 06/24/2020    Past Surgical History:  Procedure Laterality Date   CESAREAN SECTION     COLONOSCOPY WITH PROPOFOL N/A 02/08/2022   Procedure: COLONOSCOPY WITH PROPOFOL;  Surgeon: Irene Shipper, MD;  Location: [facility];  Service: Gastroenterology;  Laterality: N/A;   POLYPECTOMY  02/08/2022   Procedure: POLYPECTOMY;  Surgeon: Irene Shipper, MD;  Location: [facility];  Service: Gastroenterology;;    OB History   No obstetric history on file.      Home Medications    Prior to Admission medications   Medication Sig Start Date End Date  Taking? Authorizing Provider  allopurinol (ZYLOPRIM) 100 MG tablet Take 100 mg by mouth daily as needed (For gout). 10/30/18  Yes [provider]  amLODipine (NORVASC) 10 MG tablet Take 1 tablet by mouth daily. 03/24/21  Yes [provider]  baclofen (LIORESAL) 10 MG tablet Take 10 mg by mouth 3 (three) times daily as needed. 10/31/22  Yes [provider]  budesonide-formoterol (SYMBICORT) 160-4.5 MCG/ACT inhaler Inhale 2 puffs into the lungs 2 (two) times daily. Patient taking differently: Inhale 2 puffs into the lungs daily as needed (For shortness of breath). 08/10/21  Yes Lynden Oxford Scales, PA-C  citalopram (CELEXA) 10 MG tablet Take 1 tablet (10 mg total) by mouth daily. 02/11/22  Yes [name], MD  hydrOXYzine (VISTARIL) 25 MG capsule Take 50 mg by mouth at bedtime as needed. 10/25/22  Yes [provider]  meloxicam (MOBIC) 7.5 MG tablet Take 7.5 mg by mouth daily. 11/01/22  Yes [provider]  oxyCODONE-acetaminophen (PERCOCET/ROXICET) 5-325 MG tablet Take 1 tablet by mouth 4 (four) times daily as needed. 10/24/22  Yes [provider]  triamcinolone cream (KENALOG) 0.1 % Apply 1 application topically 2 (two) times daily. Patient taking differently: Apply 1 application  topically daily as needed (For rash). 08/17/18  Yes Wieters, Hallie C, PA-C  Vitamin D, Ergocalciferol, (DRISDOL) 1.25 MG (50000 UNIT) CAPS capsule Take 50,000 Units  by mouth once a week. 02/18/20  Yes [provider]  albuterol (PROVENTIL) (2.5 MG/3ML) 0.083% nebulizer solution Take 3 mLs (2.5 mg total) by nebulization every 6 (six) hours as needed for wheezing or shortness of breath. 11/06/18   Melynda Ripple, MD  albuterol (VENTOLIN HFA) 108 (90 Base) MCG/ACT inhaler Inhale 1 puff into the lungs every 6 (six) hours as needed for wheezing or shortness of breath. 08/10/21 02/07/22  Lynden Oxford Scales, PA-C  Spacer/Aero-Hold Chamber Bags MISC Use as directed  for inhalation of albuterol and Symbicort. 08/10/21   Lynden Oxford Scales, PA-C    Family History Family History  Problem Relation Age of Onset   Breast cancer Daughter     Social History Social History   Tobacco Use   Smoking status: Never   Smokeless tobacco: Never  Vaping Use   Vaping Use: Never used  Substance Use Topics   Alcohol use: No   Drug use: No     Allergies   Codeine and Gabapentin   Review of Systems Review of Systems Per HPI  Physical Exam Triage Vital Signs ED Triage Vitals  Enc Vitals Group     BP 11/07/22 1005 (!) 147/86     Pulse Rate 11/07/22 1005 94     Resp 11/07/22 1005 18     Temp 11/07/22 1005 98.2 F (36.8 C)     Temp Source 11/07/22 1005 Oral     SpO2 11/07/22 1005 95 %     Weight --      Height --      Head Circumference --      Peak Flow --      Pain Score 11/07/22 1001 6     Pain Loc --      Pain Edu? --      Excl. in [place]? --    No data found.  Updated Vital Signs BP (!) 147/86 (BP Location: Left Arm)   Pulse 94   Temp 98.2 F (36.8 C) (Oral)   Resp 18   SpO2 95%    Physical Exam Vitals and nursing note reviewed.  Constitutional:      General: She is not in acute distress. HENT:     Mouth/Throat:     Pharynx: Oropharynx is clear.  Cardiovascular:     Rate and Rhythm: Normal rate and regular rhythm.     Pulses: Normal pulses.  Pulmonary:     Effort: Pulmonary effort is normal.  Musculoskeletal:        General: Swelling present. No tenderness, deformity or signs of injury. Normal range of motion.     Comments: Right ankle moderately swollen compared to left.  Nontender to touch. Full ROM. Strong DP pulses. Toes nontender. No swelling extending up the leg. No warmth or erythema  Skin:    Capillary Refill: Capillary refill takes less than 2 seconds.  Neurological:     Mental Status: She is alert and oriented to person, place, and time.     UC Treatments / Results  Labs (all labs ordered are listed, but  only abnormal results are displayed) Labs Reviewed - No data to display  EKG   Radiology DG Ankle Complete Right  Result Date: 11/07/2022 CLINICAL DATA:  Right ankle pain for a couple weeks. History of gout. EXAM: RIGHT ANKLE - COMPLETE 3+ VIEW COMPARISON:  None Available. FINDINGS: The bones appear demineralized. There is no evidence of acute fracture or dislocation. No erosive changes or soft tissue calcifications are demonstrated to  confirm gout. Evidence of remote syndesmotic injury with calcaneal and midfoot spurring. Mild nonspecific soft tissue swelling around the ankle. IMPRESSION: No acute osseous findings or specific evidence of gout. Mild nonspecific soft tissue swelling. Electronically Signed   By: Richardean Sale M.D.   On: 11/07/2022 10:41    Procedures Procedures (including critical care time)  Medications Ordered in UC Medications  ketorolac (TORADOL) injection 30 mg (30 mg Intramuscular Given 11/07/22 1115)    Initial Impression / Assessment and Plan / UC Course  I have reviewed the triage vital signs and the nursing notes.  Pertinent labs & imaging results that were available during my care of the patient were reviewed by me and considered in my medical decision making (see chart for details).  Ankle xray obtained negative for both osseous abnormality or specific evidence of gout May be related to gravity/fluid imbalance although unilateral symptoms may suggest soft tissue injury Toradol injection given in clinic for pain control I recommend continue gout medications as prescribed, add tylenol as needed Can elevate and apply ice for swelling and pain Recommend to follow with foot and ankle center if symptoms persist. Patient agrees to plan  Final Clinical Impressions(s) / UC Diagnoses   Final diagnoses:  Acute right ankle pain  Ankle swelling, right     Discharge Instructions      The medicine given today should last several hours. Continue your gout  medication, and use tylenol as needed  You can try compression stockings to reduce swelling, and continue to elevate the leg. Ice may be beneficial as well.  If needed, you can follow up with the foot & ankle specialists.     ED Prescriptions   None    PDMP not reviewed this encounter.   Lenise Jr, Wells Guiles, PA-C 11/07/22 1155

## 2022-11-07 NOTE — Discharge Instructions (Addendum)
The medicine given today should last several hours. Continue your gout medication, and use tylenol as needed  You can try compression stockings to reduce swelling, and continue to elevate the leg. Ice may be beneficial as well.  If needed, you can follow up with the foot & ankle specialists.

## 2022-11-07 NOTE — ED Triage Notes (Signed)
Pt states that her right ankle has been hurting a couple weeks. She does have a hx of gout. She is on allopurinol and does have oxycodone she has been taking as needed. She also bought a brace but doesn't feel like it is helping. Her family did buy her a wheelchair to use if needed.  She is requesting a steroid injection.

## 2022-11-10 ENCOUNTER — Ambulatory Visit (HOSPITAL_COMMUNITY)
Admission: RE | Admit: 2022-11-10 | Discharge: 2022-11-10 | Disposition: A | Discharge status: Home / Self Care | Payer: Medicare HMO | Source: Ambulatory Visit | Attending: Physician Assistant | Admitting: Physician Assistant

## 2022-11-10 ENCOUNTER — Encounter (HOSPITAL_COMMUNITY): Payer: Self-pay

## 2022-11-10 VITALS — BP 159/93 | HR 97 | Temp 98.1°F | Resp 19

## 2022-11-10 DIAGNOSIS — M25571 Pain in right ankle and joints of right foot: Secondary | ICD-10-CM | POA: Diagnosis not present

## 2022-11-10 DIAGNOSIS — E119 Type 2 diabetes mellitus without complications: Secondary | ICD-10-CM | POA: Diagnosis not present

## 2022-11-10 MED ORDER — TRIAMCINOLONE ACETONIDE 40 MG/ML IJ SUSP
40.0000 mg | Freq: Once | INTRAMUSCULAR | Status: AC
Start: 2022-11-10 — End: 2022-11-10
  Administered 2022-11-10: 40 mg via INTRAMUSCULAR

## 2022-11-10 MED ORDER — TRIAMCINOLONE ACETONIDE 40 MG/ML IJ SUSP
INTRAMUSCULAR | Status: AC
  Filled 2022-11-10: qty 1

## 2022-11-10 NOTE — ED Triage Notes (Signed)
Pt reports that had right ankle pain for about 3 weeks. Was seen here last week for same complaint and asked for a steroid shot but wasn't given one. Reports xray didn't show any fracture, just swollen tissue. Reports taking pain medications.

## 2022-11-10 NOTE — ED Provider Notes (Addendum)
[place]    CSN: [id number] Arrival date & time: 11/10/22  1116      History   Chief Complaint Chief Complaint  Patient presents with   Ankle Pain    Entered by patient   appt 1130    HPI Christine Porter is a 71 y.o. female.   Patient here today for evaluation of continued right ankle pain.  She reports that she was given Toradol injection at last visit with urgent care few days ago but has not had any anxiety.  She has not had any numbness or tingling.  X-ray from last visit did not have any fracture.  She denies any new injury.  The history is provided by the patient.  Ankle Pain Associated symptoms: no fever     Past Medical History:  Diagnosis Date   Asthma    Gout    Hypertension    Osteoarthritis    Pneumonia     Patient Active Problem List   Diagnosis Date Noted   Diverticulosis of colon with hemorrhage    Benign neoplasm of transverse colon    Benign neoplasm of descending colon    Benign neoplasm of rectum    GI bleed 02/07/2022   Controlled type 2 diabetes mellitus without complication, without long-term current use of insulin ([place]) 02/07/2022   Hypokalemia 02/07/2022   Obesity (BMI 30-39.9) 02/07/2022   DDD (degenerative disc disease), cervical 06/24/2020   History of peripheral neuropathy 06/24/2020   Essential hypertension 06/24/2020   History of asthma 06/24/2020   Gouty arthritis 06/24/2020    Past Surgical History:  Procedure Laterality Date   CESAREAN SECTION     COLONOSCOPY WITH PROPOFOL N/A 02/08/2022   Procedure: COLONOSCOPY WITH PROPOFOL;  Surgeon: Irene Shipper, MD;  Location: [facility];  Service: Gastroenterology;  Laterality: N/A;   POLYPECTOMY  02/08/2022   Procedure: POLYPECTOMY;  Surgeon: Irene Shipper, MD;  Location: [facility];  Service: Gastroenterology;;    OB History   No obstetric history on file.      Home Medications    Prior to Admission medications   Medication Sig Start Date End Date  Taking? Authorizing Provider  albuterol (PROVENTIL) (2.5 MG/3ML) 0.083% nebulizer solution Take 3 mLs (2.5 mg total) by nebulization every 6 (six) hours as needed for wheezing or shortness of breath. 11/06/18   Melynda Ripple, MD  albuterol (VENTOLIN HFA) 108 (90 Base) MCG/ACT inhaler Inhale 1 puff into the lungs every 6 (six) hours as needed for wheezing or shortness of breath. 08/10/21 02/07/22  Lynden Oxford Scales, PA-C  allopurinol (ZYLOPRIM) 100 MG tablet Take 100 mg by mouth daily as needed (For gout). 10/30/18   [provider]  amLODipine (NORVASC) 10 MG tablet Take 1 tablet by mouth daily. 03/24/21   [provider]  baclofen (LIORESAL) 10 MG tablet Take 10 mg by mouth 3 (three) times daily as needed. 10/31/22   [provider]  budesonide-formoterol (SYMBICORT) 160-4.5 MCG/ACT inhaler Inhale 2 puffs into the lungs 2 (two) times daily. Patient taking differently: Inhale 2 puffs into the lungs daily as needed (For shortness of breath). 08/10/21   Lynden Oxford Scales, PA-C  citalopram (CELEXA) 10 MG tablet Take 1 tablet (10 mg total) by mouth daily. 02/11/22   [name], MD  hydrOXYzine (VISTARIL) 25 MG capsule Take 50 mg by mouth at bedtime as needed. 10/25/22   [provider]  meloxicam (MOBIC) 7.5 MG tablet Take 7.5 mg by mouth daily. 11/01/22  [provider]  oxyCODONE-acetaminophen (PERCOCET/ROXICET) 5-325 MG tablet Take 1 tablet by mouth 4 (four) times daily as needed. 10/24/22   [provider]  Spacer/Aero-Hold Chamber Bags MISC Use as directed for inhalation of albuterol and Symbicort. 08/10/21   Lynden Oxford Scales, PA-C  triamcinolone cream (KENALOG) 0.1 % Apply 1 application topically 2 (two) times daily. Patient taking differently: Apply 1 application  topically daily as needed (For rash). 08/17/18   Wieters, Hallie C, PA-C  Vitamin D, Ergocalciferol, (DRISDOL) 1.25 MG (50000 UNIT) CAPS capsule Take 50,000 Units by  mouth once a week. 02/18/20   [provider]    Family History Family History  Problem Relation Age of Onset   Breast cancer Daughter     Social History Social History   Tobacco Use   Smoking status: Never   Smokeless tobacco: Never  Vaping Use   Vaping Use: Never used  Substance Use Topics   Alcohol use: No   Drug use: No     Allergies   Codeine and Gabapentin   Review of Systems Review of Systems  Constitutional:  Negative for chills and fever.  Eyes:  Negative for discharge and redness.  Gastrointestinal:  Negative for nausea and vomiting.  Musculoskeletal:  Positive for arthralgias and joint swelling.  Skin:  Negative for color change.  Neurological:  Negative for numbness.     Physical Exam Triage Vital Signs ED Triage Vitals  Enc Vitals Group     BP 11/10/22 1128 (!) 159/93     Pulse Rate 11/10/22 1128 97     Resp 11/10/22 1128 19     Temp 11/10/22 1128 98.1 F (36.7 C)     Temp Source 11/10/22 1128 Oral     SpO2 11/10/22 1128 97 %     Weight --      Height --      Head Circumference --      Peak Flow --      Pain Score 11/10/22 1127 6     Pain Loc --      Pain Edu? --      Excl. in [place]? --    No data found.  Updated Vital Signs BP (!) 159/93 (BP Location: Left Arm)   Pulse 97   Temp 98.1 F (36.7 C) (Oral)   Resp 19   SpO2 97%      Physical Exam Vitals and nursing note reviewed.  Constitutional:      General: She is not in acute distress.    Appearance: Normal appearance. She is not ill-appearing.  HENT:     Head: Normocephalic and atraumatic.  Eyes:     Conjunctiva/sclera: Conjunctivae normal.  Cardiovascular:     Rate and Rhythm: Normal rate.  Pulmonary:     Effort: Pulmonary effort is normal. No respiratory distress.  Musculoskeletal:     Comments: Mild swelling appreciated to right ankle diffusely, mild TTP to medial malleolus, normal DP pulse  Neurological:     Mental Status: She is alert.     Comments: Gross  sensation intact to right toes  Psychiatric:        Mood and Affect: Mood normal.        Behavior: Behavior normal.        Thought Content: Thought content normal.      UC Treatments / Results  Labs (all labs ordered are listed, but only abnormal results are displayed) Labs Reviewed - No data to display  EKG   Radiology  No results found.  Procedures Procedures (including critical care time)  Medications Ordered in UC Medications  triamcinolone acetonide (KENALOG-40) injection 40 mg (40 mg Intramuscular Given 11/10/22 1201)    Initial Impression / Assessment and Plan / UC Course  I have reviewed the triage vital signs and the nursing notes.  Pertinent labs & imaging results that were available during my care of the patient were reviewed by me and considered in my medical decision making (see chart for details).    Steroid injection administered in office as this has helped patient in the past. Patient is a diabetic however does seem to be well controlled. Recommended follow up if no gradual improvement or with any further concerns.   Final Clinical Impressions(s) / UC Diagnoses   Final diagnoses:  Acute right ankle pain  Controlled type 2 diabetes mellitus without complication, without long-term current use of insulin [facility])   Discharge Instructions   None    ED Prescriptions   None    PDMP not reviewed this encounter.   Francene Finders, PA-C 11/10/22 1538    Francene Finders, PA-C 11/10/22 1539

## 2022-12-25 IMAGING — CT CT CTA ABD/PEL W/CM AND/OR W/O CM
3 of 13 series · 12 of 46 positions shown, 17 images · IV contrast (APPLIED)
Comparison: Prior CT of the abdomen and pelvis on 01/30/2007

CLINICAL DATA: GI bleed with bright red blood per rectum.

EXAM:
CT ANGIOGRAPHY ABDOMEN AND PELVIS WITH CONTRAST AND WITHOUT CONTRAST
TECHNIQUE: Multidetector CT imaging of the abdomen and pelvis was performed
using the standard protocol during bolus administration of
intravenous contrast. Multiplanar reconstructed images and MIPs were
obtained and reviewed to evaluate the vascular anatomy.

[Series 5: axial arterial · axial · arterial · 0.94mm/px · z∈[-526,-146]mm · 8 of 226 slices shown]
[im 18/226  soft-tissue]
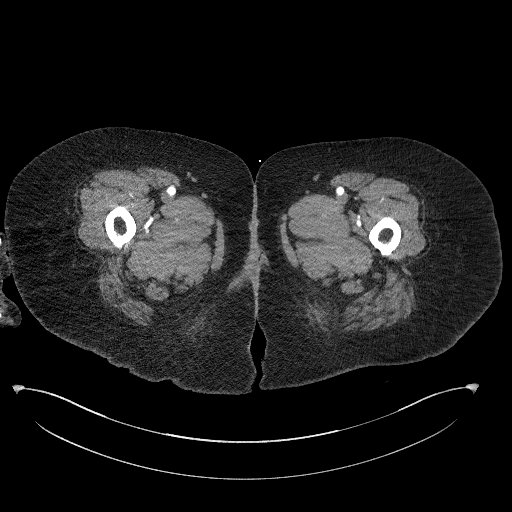
[im 52/226  soft-tissue]
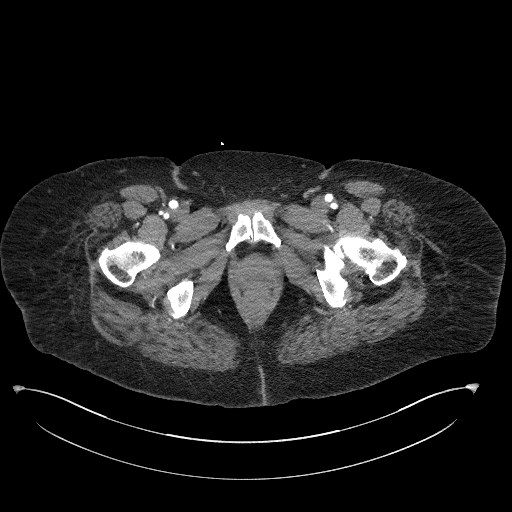
[im 70/226  soft-tissue]
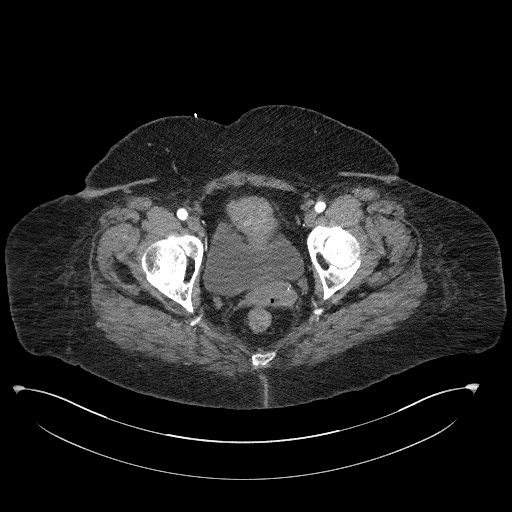
[im 104/226  soft-tissue]
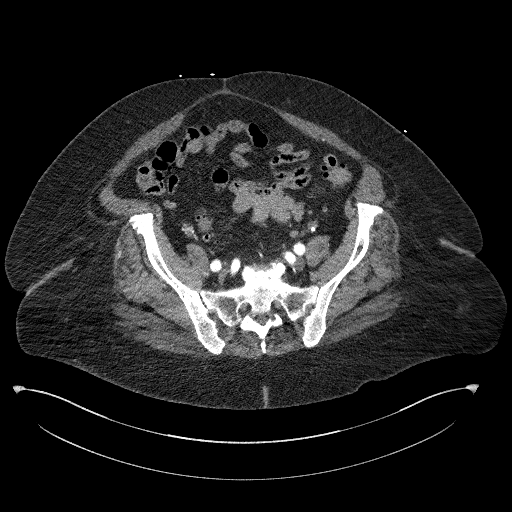
[im 122/226  soft-tissue]
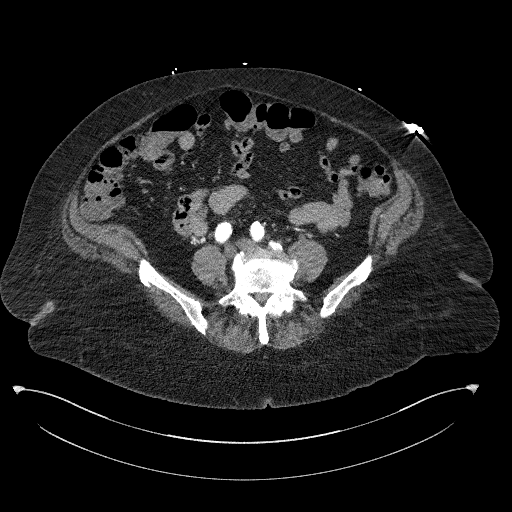
[im 156/226  soft-tissue]
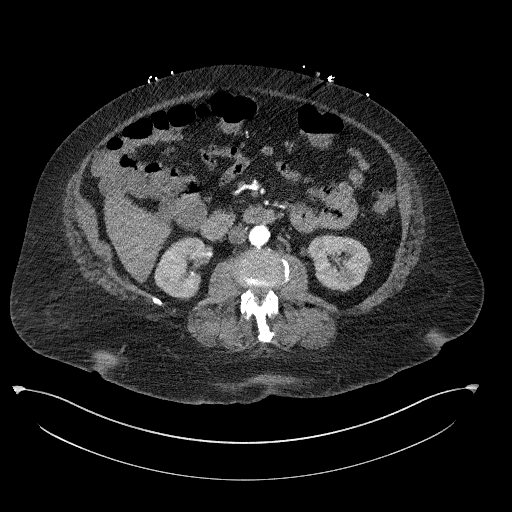
[im 174/226  soft-tissue]
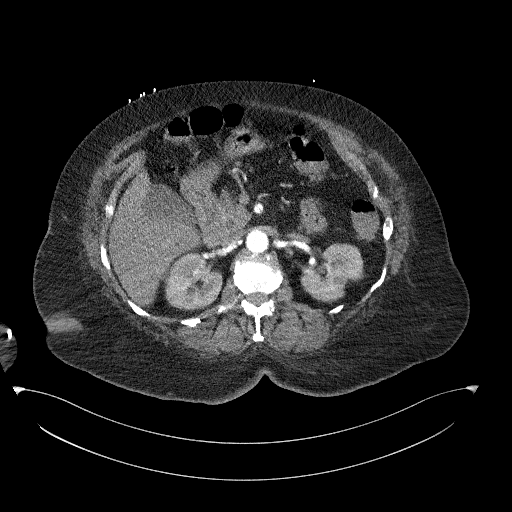
[im 208/226  soft-tissue]
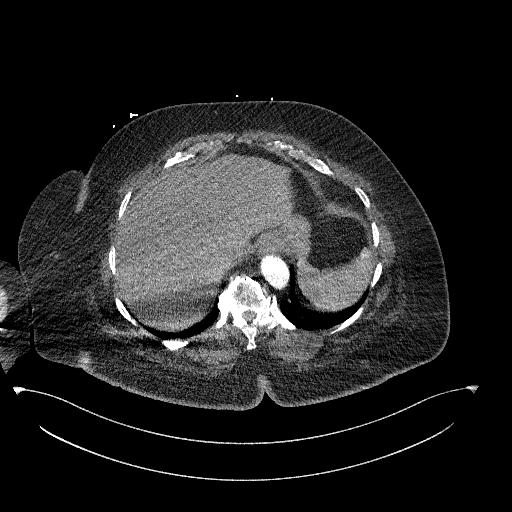

[Series 8: coronals · coronal · 0.79mm/px · 1 of 168 slices shown, 2 images]
[im 84/168  soft-tissue]
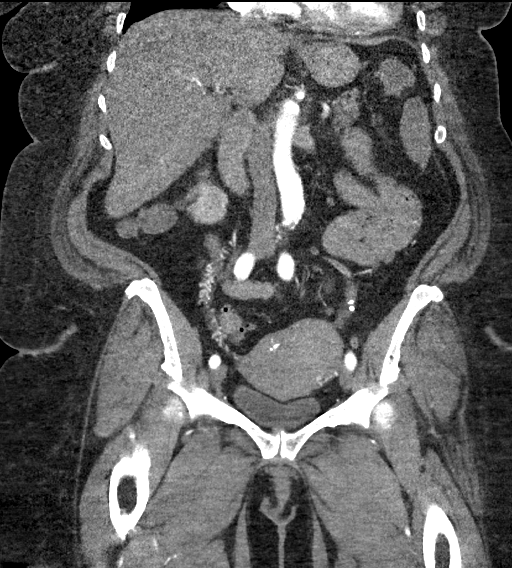
[im 84/168  bone]
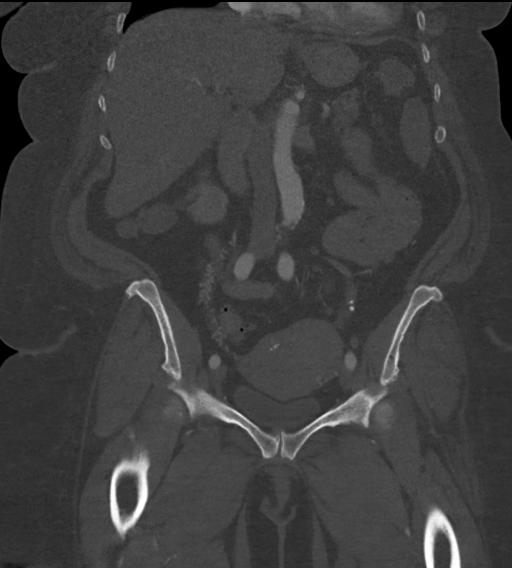

[Series 12: axial portal (person_name) · axial · portal-venous · 0.75mm/px · z∈[-435,-220]mm · 3 of 87 slices shown, 7 images]
[im 22/87  soft-tissue]
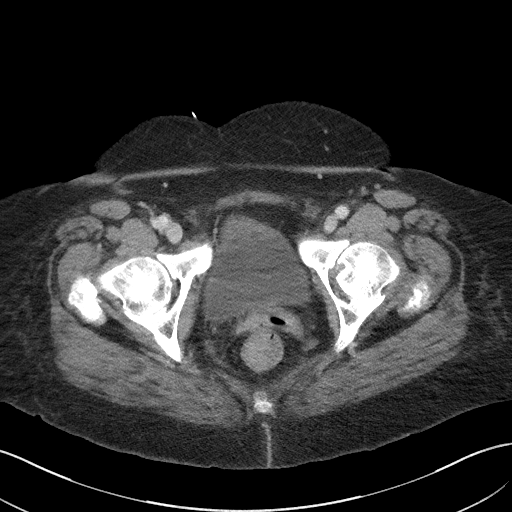
[im 22/87  lung]
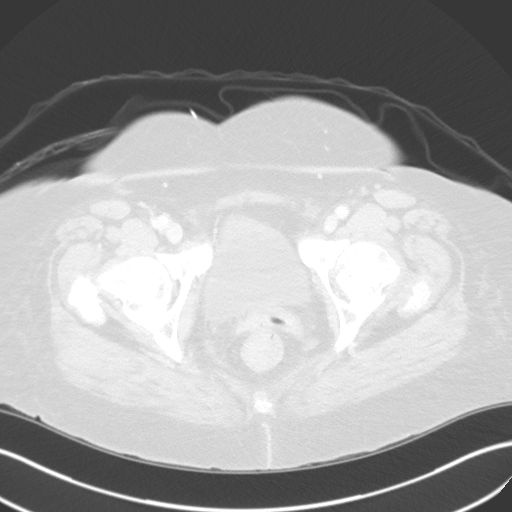
[im 22/87  bone]
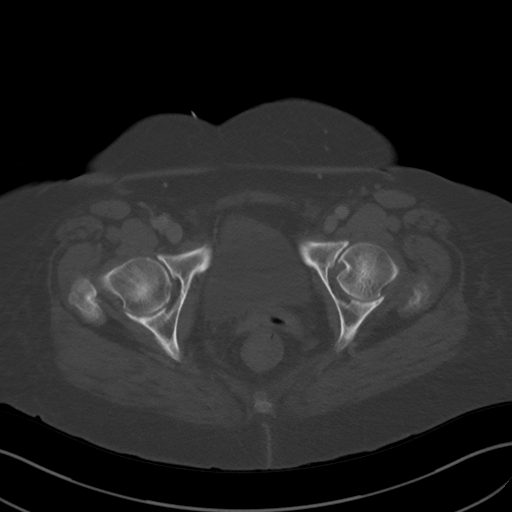
[im 44/87  soft-tissue]
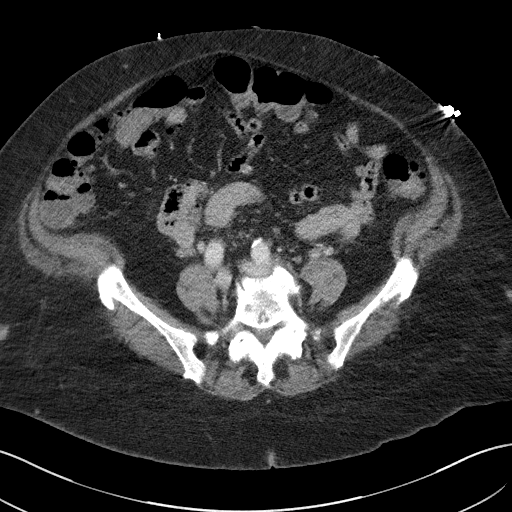
[im 44/87  lung]
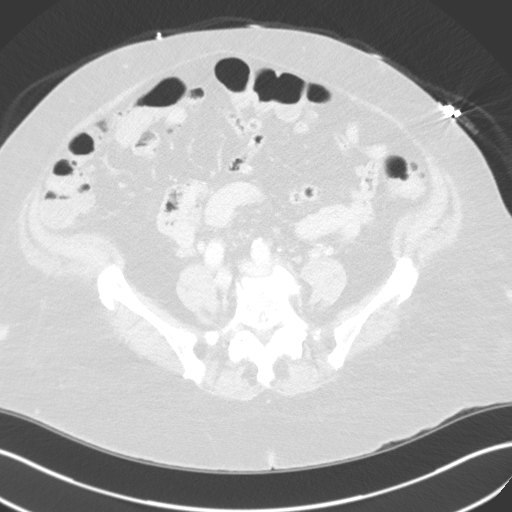
[im 65/87  soft-tissue]
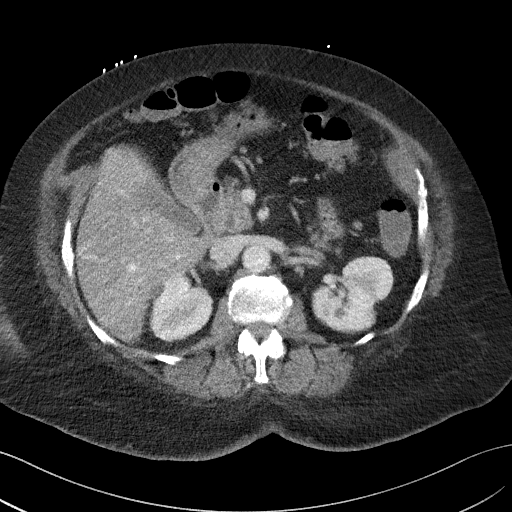
[im 65/87  lung]
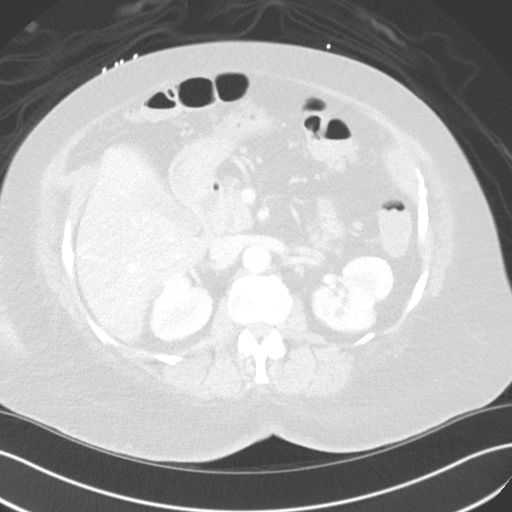

[12 of 46 positions shown; findings below may reference images not displayed]

RADIATION DOSE REDUCTION: This exam was performed according to the
departmental dose-optimization program which includes automated
exposure control, adjustment of the mA and/or kV according to
patient size and/or use of iterative reconstruction technique.

CONTRAST:  100mL OMNIPAQUE IOHEXOL 350 MG/ML SOLN
FINDINGS: VASCULAR

Aorta: Mild atherosclerosis without evidence of aneurysm, dissection
or stenosis.

Celiac: Mild calcified plaque at origin without significant
stenosis. Normally patent branch vessels and branch vessel anatomy.

SMA: Mild atherosclerosis without significant stenosis.

Renals: Mild calcified plaque at the origins of bilateral single
renal arteries without significant stenosis.

IMA: Normally patent.

Inflow: Bilateral iliac arteries demonstrate minimal atherosclerosis
and normal patency without aneurysm.

Proximal Outflow: Common femoral arteries and femoral bifurcations
are normally patent.

Veins: Venous phase imaging demonstrates normal patency of venous
structures including mesenteric veins, splenic vein, portal vein,
IVC, renal veins, iliac veins and common femoral veins.

Review of the MIP images confirms the above findings.

NON-VASCULAR

Lower chest: No acute abnormality.

Hepatobiliary: Evidence of hepatic steatosis. No hepatic masses
common biliary dilatation or gallbladder abnormalities.

Pancreas: Unremarkable. No pancreatic ductal dilatation or
surrounding inflammatory changes.

Spleen: No splenic injury or perisplenic hematoma.

Adrenals/Urinary Tract: Adrenal glands are unremarkable. Kidneys are
normal, without renal calculi, focal lesion, or hydronephrosis.
Small bilateral scattered renal cysts. Bladder is unremarkable.

Stomach/Bowel: Bowel shows no evidence of obstruction, ileus,
inflammation or visible lesion. There is diverticulosis of the
sigmoid, descending and transverse colon without evidence of acute
diverticulitis. No evidence active bleeding into the
gastrointestinal tract on arterial or venous phases of imaging. No
evidence to suggest obvious arteriovenous malformations of bowel or
visible pseudoaneurysms.

Lymphatic: No enlarged lymph nodes identified.

Reproductive: Left-sided uterine fundal mass is consistent with a
fibroid and measures up to approximately 4 cm in maximum diameter.

Other: No abdominal wall hernia or abnormality. No abdominopelvic
ascites.

Musculoskeletal: No acute or significant osseous findings.
IMPRESSION: 1. No evidence of active bleeding into the gastrointestinal tract by
CT angiography.
2. No significant mesenteric arterial occlusive disease or other
vascular findings other than mild scattered atherosclerosis.
3. Diverticulosis of the colon without evidence diverticulitis.
4. Hepatic steatosis.
5. Uterine fibroid.

## 2023-04-08 ENCOUNTER — Telehealth: Payer: Self-pay | Admitting: Internal Medicine

## 2023-04-08 NOTE — Telephone Encounter (Signed)
Patient and her 2 daughters traveling in [place]. Maisie Fus She started having multiple episodes of hematochezia associated with dizziness and lightheadedness They were planning to try to come home by airplane urgently but have made the decision to be seen in the emergency room there.  They are about 3 minutes away from [facility] emergency room. I told him they were definitely doing the right thing.  They should go to the nearest emergency room for care for what is probably diverticular bleeding. They will assess her there and make treatment decisions.  The patient's daughter had multiple questions which I answered.  I will alert Dr. Marina Goodell, the patient's primary gastroenterologist  The patient's daughter thanked me for the call

## 2023-04-08 NOTE — Telephone Encounter (Signed)
Daughter called back. Mother is admitted at [facility] with continued [painless hematochezia. Hemoglobin at 10 at this time. I was able to speak with physician there, provided history of diverticular bleed in March of last year, CTA negative, 2 units PRBC, had colonoscopy at that time for 4 tubular adenomatous polyps and diverticulosis.

## 2023-04-09 NOTE — Telephone Encounter (Signed)
Seen once in hospital.  Noted.  Thanks

## 2023-06-05 ENCOUNTER — Other Ambulatory Visit: Payer: Self-pay | Admitting: Internal Medicine

## 2023-06-06 LAB — BASIC METABOLIC PANEL WITH GFR
BUN: 18 mg/dL (ref 7–25)
CO2: 30 mmol/L (ref 20–32)
Calcium: 9.2 mg/dL (ref 8.6–10.4)
Chloride: 98 mmol/L (ref 98–110)
Creat: 0.73 mg/dL (ref 0.60–1.00)
Glucose, Bld: 94 mg/dL (ref 65–99)
Potassium: 3.5 mmol/L (ref 3.5–5.3)
Sodium: 140 mmol/L (ref 135–146)
eGFR: 86 mL/min/{1.73_m2} (ref >=60)

## 2023-06-06 LAB — VITAMIN D 25 HYDROXY (VIT D DEFICIENCY, FRACTURES): Vit D, 25-Hydroxy: 41 ng/mL (ref 30–100)

## 2023-06-06 LAB — CBC
HCT: 33.4 % — ABNORMAL LOW (ref 35.0–45.0)
Hemoglobin: 10.9 g/dL — ABNORMAL LOW (ref 11.7–15.5)
MCH: 25.2 pg — ABNORMAL LOW (ref 27.0–33.0)
MCHC: 32.6 g/dL (ref 32.0–36.0)
MCV: 77.1 fL — ABNORMAL LOW (ref 80.0–100.0)
MPV: 10 fL (ref 7.5–12.5)
Platelets: 278 10*3/uL (ref 140–400)
RBC: 4.33 10*6/uL (ref 3.80–5.10)
RDW: 15.8 % — ABNORMAL HIGH (ref 11.0–15.0)
WBC: 6.5 10*3/uL (ref 3.8–10.8)

## 2023-06-06 LAB — VITAMIN B12: Vitamin B-12: 580 pg/mL (ref 200–1100)

## 2023-06-06 LAB — FOLATE: Folate: 8.5 ng/mL

## 2023-08-23 ENCOUNTER — Other Ambulatory Visit: Payer: Self-pay

## 2023-08-23 ENCOUNTER — Ambulatory Visit
Admission: RE | Admit: 2023-08-23 | Discharge: 2023-08-23 | Disposition: A | Discharge status: Home / Self Care | Payer: Medicare HMO | Source: Ambulatory Visit | Attending: Physician Assistant | Admitting: Physician Assistant

## 2023-08-23 VITALS — BP 167/91 | HR 86 | Temp 98.6°F | Resp 18

## 2023-08-23 DIAGNOSIS — G8929 Other chronic pain: Secondary | ICD-10-CM | POA: Diagnosis not present

## 2023-08-23 DIAGNOSIS — M549 Dorsalgia, unspecified: Secondary | ICD-10-CM | POA: Diagnosis not present

## 2023-08-23 MED ORDER — DEXAMETHASONE SODIUM PHOSPHATE 10 MG/ML IJ SOLN
10.0000 mg | Freq: Once | INTRAMUSCULAR | Status: AC
  Administered 2023-08-23: 10 mg via INTRAMUSCULAR

## 2023-08-23 MED ORDER — KETOROLAC TROMETHAMINE 15 MG/ML IJ SOLN
15.0000 mg | Freq: Once | INTRAMUSCULAR | Status: AC
  Administered 2023-08-23: 15 mg via INTRAMUSCULAR

## 2023-08-23 NOTE — ED Provider Notes (Signed)
[name]    CSN: [id number] Arrival date & time: 08/23/23  1101      History   Chief Complaint Chief Complaint  Patient presents with   Back Pain    Entered by patient    HPI Christine Porter is a 72 y.o. female.   Complains of pain in her back.  Patient reports that she is going to pain management and is on oxycodone but is having an exacerbation of her pain.  Patient reports that she is very stiff.  Patient has had injections of cortisone drugs in the past that have helped.  Patient denies any fever or chills.  She is allergic to codeine and gabapentin.   Back Pain   Past Medical History:  Diagnosis Date   Asthma    Gout    Hypertension    Osteoarthritis    Pneumonia     Patient Active Problem List   Diagnosis Date Noted   Diverticulosis of colon with hemorrhage    Benign neoplasm of transverse colon    Benign neoplasm of descending colon    Benign neoplasm of rectum    GI bleed 02/07/2022   Controlled type 2 diabetes mellitus without complication, without long-term current use of insulin (HCC) 02/07/2022   Hypokalemia 02/07/2022   Obesity (BMI 30-39.9) 02/07/2022   DDD (degenerative disc disease), cervical 06/24/2020   History of peripheral neuropathy 06/24/2020   Essential hypertension 06/24/2020   History of asthma 06/24/2020   Gouty arthritis 06/24/2020    Past Surgical History:  Procedure Laterality Date   CESAREAN SECTION     COLONOSCOPY WITH PROPOFOL N/A 02/08/2022   Procedure: COLONOSCOPY WITH PROPOFOL;  Surgeon: Hilarie Fredrickson, MD;  Location: [facility];  Service: Gastroenterology;  Laterality: N/A;   POLYPECTOMY  02/08/2022   Procedure: POLYPECTOMY;  Surgeon: Hilarie Fredrickson, MD;  Location: [facility];  Service: Gastroenterology;;    OB History   No obstetric history on file.      Home Medications    Prior to Admission medications   Medication Sig Start Date End Date Taking? Authorizing Provider  albuterol (PROVENTIL)  (2.5 MG/3ML) 0.083% nebulizer solution Take 3 mLs (2.5 mg total) by nebulization every 6 (six) hours as needed for wheezing or shortness of breath. 11/06/18   Domenick Gong, MD  albuterol (VENTOLIN HFA) 108 (90 Base) MCG/ACT inhaler Inhale 1 puff into the lungs every 6 (six) hours as needed for wheezing or shortness of breath. 08/10/21 02/07/22  Theadora Rama Scales, PA-C  allopurinol (ZYLOPRIM) 100 MG tablet Take 100 mg by mouth daily as needed (For gout). 10/30/18   [provider]  amLODipine (NORVASC) 10 MG tablet Take 1 tablet by mouth daily. 03/24/21   [provider]  baclofen (LIORESAL) 10 MG tablet Take 10 mg by mouth 3 (three) times daily as needed. 10/31/22   [provider]  budesonide-formoterol (SYMBICORT) 160-4.5 MCG/ACT inhaler Inhale 2 puffs into the lungs 2 (two) times daily. Patient taking differently: Inhale 2 puffs into the lungs daily as needed (For shortness of breath). 08/10/21   Theadora Rama Scales, PA-C  citalopram (CELEXA) 10 MG tablet Take 1 tablet (10 mg total) by mouth daily. 02/11/22   Meredeth Ide, MD  hydrOXYzine (VISTARIL) 25 MG capsule Take 50 mg by mouth at bedtime as needed. 10/25/22   [provider]  meloxicam (MOBIC) 7.5 MG tablet Take 7.5 mg by mouth daily. 11/01/22   [provider]  oxyCODONE-acetaminophen (PERCOCET/ROXICET) 5-325 MG tablet  Take 1 tablet by mouth 4 (four) times daily as needed. 10/24/22   [provider]  Spacer/Aero-Hold Chamber Bags MISC Use as directed for inhalation of albuterol and Symbicort. 08/10/21   Theadora Rama Scales, PA-C  triamcinolone cream (KENALOG) 0.1 % Apply 1 application topically 2 (two) times daily. Patient taking differently: Apply 1 application  topically daily as needed (For rash). 08/17/18   Wieters, Hallie C, PA-C  Vitamin D, Ergocalciferol, (DRISDOL) 1.25 MG (50000 UNIT) CAPS capsule Take 50,000 Units by mouth once a week. 02/18/20   [provider]     Family History Family History  Problem Relation Age of Onset   Breast cancer Daughter     Social History Social History   Tobacco Use   Smoking status: Never   Smokeless tobacco: Never  Vaping Use   Vaping status: Never Used  Substance Use Topics   Alcohol use: No   Drug use: No     Allergies   Codeine and Gabapentin   Review of Systems Review of Systems  Musculoskeletal:  Positive for back pain.  All other systems reviewed and are negative.    Physical Exam Triage Vital Signs ED Triage Vitals  Encounter Vitals Group     BP 08/23/23 1140 (!) 167/91     Systolic BP Percentile --      Diastolic BP Percentile --      Pulse Rate 08/23/23 1140 86     Resp 08/23/23 1140 18     Temp 08/23/23 1140 98.6 F (37 C)     Temp Source 08/23/23 1140 Oral     SpO2 08/23/23 1140 96 %     Weight --      Height --      Head Circumference --      Peak Flow --      Pain Score 08/23/23 1141 6     Pain Loc --      Pain Education --      Exclude from Growth Chart --    No data found.  Updated Vital Signs BP (!) 167/91 (BP Location: Left Arm)   Pulse 86   Temp 98.6 F (37 C) (Oral)   Resp 18   SpO2 96%   Visual Acuity Right Eye Distance:   Left Eye Distance:   Bilateral Distance:    Right Eye Near:   Left Eye Near:    Bilateral Near:     Physical Exam Vitals and nursing note reviewed.  Constitutional:      Appearance: She is well-developed.  HENT:     Head: Normocephalic.  Cardiovascular:     Rate and Rhythm: Normal rate.  Pulmonary:     Effort: Pulmonary effort is normal.  Abdominal:     General: Abdomen is flat. There is no distension.  Musculoskeletal:        General: Normal range of motion.     Cervical back: Normal range of motion.  Skin:    General: Skin is warm.  Neurological:     General: No focal deficit present.     Mental Status: She is alert and oriented to person, place, and time.  Psychiatric:        Mood and Affect: Mood normal.       UC Treatments / Results  Labs (all labs ordered are listed, but only abnormal results are displayed) Labs Reviewed - No data to display  EKG   Radiology No results found.  Procedures Procedures (including critical care time)  Medications Ordered in UC Medications  dexamethasone (DECADRON) injection 10 mg (has no administration in time range)  ketorolac (TORADOL) 15 MG/ML injection 15 mg (has no administration in time range)    Initial Impression / Assessment and Plan / UC Course  I have reviewed the triage vital signs and the nursing notes.  Pertinent labs & imaging results that were available during my care of the patient were reviewed by me and considered in my medical decision making (see chart for details).    An After Visit Summary was printed and given to the patient.      Final Clinical Impressions(s) / UC Diagnoses   Final diagnoses:  None   Discharge Instructions   None    ED Prescriptions   None    PDMP not reviewed this encounter.   Elson Areas, [place] 08/23/23 1236

## 2023-08-23 NOTE — Discharge Instructions (Signed)
Follow up with your Physician for recheck

## 2023-08-23 NOTE — ED Triage Notes (Signed)
Pt sts upper back and neck pain with hx of similar x years worse over last 5 days; denies new injury

## 2023-11-29 ENCOUNTER — Telehealth: Payer: Self-pay | Admitting: Internal Medicine

## 2023-11-29 NOTE — Telephone Encounter (Signed)
 Error

## 2024-06-13 ENCOUNTER — Ambulatory Visit
Admission: RE | Admit: 2024-06-13 | Discharge: 2024-06-13 | Disposition: A | Discharge status: Home / Self Care | Source: Ambulatory Visit

## 2024-06-13 VITALS — BP 148/89 | HR 84 | Temp 98.3°F | Resp 20 | Ht 64.0 in | Wt 213.8 lb

## 2024-06-13 DIAGNOSIS — M109 Gout, unspecified: Secondary | ICD-10-CM

## 2024-06-13 MED ORDER — KETOROLAC TROMETHAMINE 30 MG/ML IJ SOLN
30.0000 mg | Freq: Once | INTRAMUSCULAR | Status: AC
Start: 2024-06-13 — End: 2024-06-13
  Administered 2024-06-13: 30 mg via INTRAMUSCULAR

## 2024-06-13 MED ORDER — METHYLPREDNISOLONE ACETATE 80 MG/ML IJ SUSP
80.0000 mg | Freq: Once | INTRAMUSCULAR | Status: AC
Start: 2024-06-13 — End: 2024-06-13
  Administered 2024-06-13: 80 mg via INTRAMUSCULAR

## 2024-06-13 NOTE — ED Provider Notes (Signed)
 [name]    CSN: [id number] Arrival date & time: 06/13/24  1242      History   Chief Complaint Chief Complaint  Patient presents with   Joint Pain    Entered by patient    HPI Christine Porter is a 73 y.o. female.   HPI Patient with a history of degenerative disk disease, and chronic gouty arthritis, and osteoarthritis presents today with 1 week of worsening generalized body pain.  This is impacting her ability to walk and move appropriately as she is having severe stiffness and achiness.  She has been attempting to treat symptoms with over-the-counter medication without any improvement.  She is scheduled to see her primary care doctor next month and is requesting an injection that she received the last time she was seen here in the clinic last year around September. Past Medical History:  Diagnosis Date   Asthma    Gout    Hypertension    Osteoarthritis    Pneumonia     Patient Active Problem List   Diagnosis Date Noted   Diverticulosis of colon with hemorrhage    Benign neoplasm of transverse colon    Benign neoplasm of descending colon    Benign neoplasm of rectum    GI bleed 02/07/2022   Controlled type 2 diabetes mellitus without complication, without long-term current use of insulin  (HCC) 02/07/2022   Hypokalemia 02/07/2022   Obesity (BMI 30-39.9) 02/07/2022   DDD (degenerative disc disease), cervical 06/24/2020   History of peripheral neuropathy 06/24/2020   Essential hypertension 06/24/2020   History of asthma 06/24/2020   Gouty arthritis 06/24/2020    Past Surgical History:  Procedure Laterality Date   CESAREAN SECTION     COLONOSCOPY WITH PROPOFOL  N/A 02/08/2022   Procedure: COLONOSCOPY WITH PROPOFOL ;  Surgeon: Abran Norleen SAILOR, MD;  Location: [facility];  Service: Gastroenterology;  Laterality: N/A;   POLYPECTOMY  02/08/2022   Procedure: POLYPECTOMY;  Surgeon: Abran Norleen SAILOR, MD;  Location: [facility];  Service: Gastroenterology;;    OB  History   No obstetric history on file.      Home Medications    Prior to Admission medications   Medication Sig Start Date End Date Taking? Authorizing Provider  amLODipine  (NORVASC ) 10 MG tablet Take 1 tablet by mouth daily. 03/24/21  Yes [provider]  furosemide  (LASIX ) 20 MG tablet Take 20 mg by mouth as needed for fluid.   Yes [provider]  ondansetron  (ZOFRAN -ODT) 4 MG disintegrating tablet Take 4 mg by mouth every 8 (eight) hours as needed. 12/17/23  Yes [provider]  oxyCODONE -acetaminophen  (PERCOCET/ROXICET) 5-325 MG tablet Take 1 tablet by mouth 4 (four) times daily as needed. 10/24/22  Yes [provider]  predniSONE  (DELTASONE ) 10 MG tablet Take 10 mg by mouth daily. 02/05/24  Yes [provider]  tiZANidine  (ZANAFLEX ) 2 MG tablet Take 2 mg by mouth 2 (two) times daily as needed. 05/16/24  Yes [provider]  tobramycin-dexamethasone  (TOBRADEX) ophthalmic solution 1 drop every 6 (six) hours. 03/05/24  Yes [provider]  traZODone  (DESYREL ) 100 MG tablet Take 100 mg by mouth at bedtime. 03/13/24  Yes [provider]  albuterol  (PROVENTIL ) (2.5 MG/3ML) 0.083% nebulizer solution Take 3 mLs (2.5 mg total) by nebulization every 6 (six) hours as needed for wheezing or shortness of breath. 11/06/18   Van Knee, MD  albuterol  (VENTOLIN  HFA) 108 [id number] Base) MCG/ACT inhaler Inhale 1 puff into the lungs every 6 (six) hours  as needed for wheezing or shortness of breath. 08/10/21 02/07/22  Joesph Shaver Scales, PA-C  allopurinol (ZYLOPRIM) 100 MG tablet Take 100 mg by mouth daily as needed (For gout). 10/30/18   [provider]  baclofen (LIORESAL) 10 MG tablet Take 10 mg by mouth 3 (three) times daily as needed. 10/31/22   [provider]  budesonide -formoterol  (SYMBICORT ) 160-4.5 MCG/ACT inhaler Inhale 2 puffs into the lungs 2 (two) times daily. Patient taking differently: Inhale 2 puffs into  the lungs daily as needed (For shortness of breath). 08/10/21   Joesph Shaver Scales, PA-C  citalopram  (CELEXA ) 10 MG tablet Take 1 tablet (10 mg total) by mouth daily. 02/11/22   Drusilla Sabas RAMAN, MD  hydrOXYzine (VISTARIL) 25 MG capsule Take 50 mg by mouth at bedtime as needed. 10/25/22   [provider]  meloxicam (MOBIC) 7.5 MG tablet Take 7.5 mg by mouth daily. 11/01/22   [provider]  Spacer/Aero-Hold Chamber Bags MISC Use as directed for inhalation of albuterol  and Symbicort . 08/10/21   Joesph Shaver Scales, PA-C  triamcinolone  cream (KENALOG ) 0.1 % Apply 1 application topically 2 (two) times daily. Patient taking differently: Apply 1 application  topically daily as needed (For rash). 08/17/18   Wieters, Hallie C, PA-C  Vitamin D , Ergocalciferol , (DRISDOL) 1.25 MG (50000 UNIT) CAPS capsule Take 50,000 Units by mouth once a week. 02/18/20   [provider]    Family History Family History  Problem Relation Age of Onset   Breast cancer Daughter     Social History Social History   Tobacco Use   Smoking status: Never   Smokeless tobacco: Never  Vaping Use   Vaping status: Never Used  Substance Use Topics   Alcohol use: No   Drug use: No     Allergies   Codeine and Gabapentin   Review of Systems Review of Systems Pertinent negatives listed in HPI  Physical Exam Triage Vital Signs ED Triage Vitals  Encounter Vitals Group     BP 06/13/24 1304 (!) 148/89     Girls Systolic BP Percentile --      Girls Diastolic BP Percentile --      Boys Systolic BP Percentile --      Boys Diastolic BP Percentile --      Pulse Rate 06/13/24 1304 84     Resp 06/13/24 1304 20     Temp 06/13/24 1304 98.3 F (36.8 C)     Temp Source 06/13/24 1304 Oral     SpO2 06/13/24 1304 95 %     Weight 06/13/24 1259 213 lb 13.5 oz (97 kg)     Height 06/13/24 1259 5' 4 (1.626 m)     Head Circumference --      Peak Flow --      Pain Score 06/13/24 1257 7     Pain Loc --       Pain Education --      Exclude from Growth Chart --    No data found.  Updated Vital Signs BP (!) 148/89 (BP Location: Right Arm)   Pulse 84   Temp 98.3 F (36.8 C) (Oral)   Resp 20   Ht 5' 4 (1.626 m)   Wt 213 lb 13.5 oz (97 kg)   SpO2 95%   BMI 36.71 kg/m   Visual Acuity Right Eye Distance:   Left Eye Distance:   Bilateral Distance:    Right Eye Near:   Left Eye Near:    Bilateral Near:  Physical Exam Vitals reviewed.  Constitutional:      Appearance: Normal appearance.  HENT:     Head: Normocephalic and atraumatic.  Eyes:     Extraocular Movements: Extraocular movements intact.     Pupils: Pupils are equal, round, and reactive to light.  Cardiovascular:     Rate and Rhythm: Normal rate and regular rhythm.  Pulmonary:     Effort: Pulmonary effort is normal.     Breath sounds: Normal breath sounds.  Musculoskeletal:        General: Swelling (Joints bilateral hands, bilateral knees, antalgic gait) present.     Cervical back: Normal range of motion.  Skin:    General: Skin is warm.     Capillary Refill: Capillary refill takes less than 2 seconds.  Neurological:     Mental Status: She is alert. Mental status is at baseline.     Gait: Gait abnormal.      UC Treatments / Results  Labs (all labs ordered are listed, but only abnormal results are displayed) Labs Reviewed - No data to display  EKG   Radiology No results found.  Procedures Procedures (including critical care time)  Medications Ordered in UC Medications  methylPREDNISolone  acetate (DEPO-MEDROL ) injection 80 mg (80 mg Intramuscular Given 06/13/24 1325)  ketorolac  (TORADOL ) 30 MG/ML injection 30 mg (30 mg Intramuscular Given 06/13/24 1325)    Initial Impression / Assessment and Plan / UC Course  I have reviewed the triage vital signs and the nursing notes.  Pertinent labs & imaging results that were available during my care of the patient were reviewed by me and considered in my  medical decision making (see chart for details).   Generalized gouty arthritis, Toradol  and Decadron  IM injections given here in clinic.  Patient tolerated.  Patient given strict precautions to follow-up with her PCP regarding ongoing management.  Also advised to avoid ASA's and NSAIDs for at least 8 hours after receiving medications here in clinic.  Patient verbalized understanding and agreement with plan. Final Clinical Impressions(s) / UC Diagnoses   Final diagnoses:  Generalized gouty arthritis     Discharge Instructions      Follow-up with PCP regarding ongoing management of symptoms.  Avoid taking any ibuprofen  or aspirin  products for at least 8 hours as she received a Toradol  injection.     ED Prescriptions   None    PDMP not reviewed this encounter.   Arloa Suzen RAMAN, NP 06/16/24 [phone]

## 2024-06-13 NOTE — ED Triage Notes (Signed)
 I am stiff as a board, my bones ache, my primary care follow's me closely for this but had not time for an appointment, I am asking for an injection of streroid and something for pain, same as last time.

## 2024-06-13 NOTE — Discharge Instructions (Signed)
 Follow-up with PCP regarding ongoing management of symptoms.  Avoid taking any ibuprofen  or aspirin  products for at least 8 hours as she received a Toradol  injection.

## 2024-08-01 ENCOUNTER — Emergency Department (HOSPITAL_BASED_OUTPATIENT_CLINIC_OR_DEPARTMENT_OTHER)
Admission: EM | Admit: 2024-08-01 | Discharge: 2024-08-01 | Disposition: A | Discharge status: Home / Self Care | Attending: Emergency Medicine | Admitting: Emergency Medicine

## 2024-08-01 ENCOUNTER — Emergency Department (HOSPITAL_BASED_OUTPATIENT_CLINIC_OR_DEPARTMENT_OTHER)

## 2024-08-01 ENCOUNTER — Other Ambulatory Visit: Payer: Self-pay

## 2024-08-01 ENCOUNTER — Encounter (HOSPITAL_BASED_OUTPATIENT_CLINIC_OR_DEPARTMENT_OTHER): Payer: Self-pay | Admitting: Emergency Medicine

## 2024-08-01 DIAGNOSIS — I10 Essential (primary) hypertension: Secondary | ICD-10-CM | POA: Diagnosis not present

## 2024-08-01 DIAGNOSIS — Z79899 Other long term (current) drug therapy: Secondary | ICD-10-CM | POA: Insufficient documentation

## 2024-08-01 DIAGNOSIS — I493 Ventricular premature depolarization: Secondary | ICD-10-CM | POA: Diagnosis not present

## 2024-08-01 DIAGNOSIS — J45909 Unspecified asthma, uncomplicated: Secondary | ICD-10-CM | POA: Diagnosis not present

## 2024-08-01 DIAGNOSIS — R002 Palpitations: Secondary | ICD-10-CM | POA: Diagnosis present

## 2024-08-01 DIAGNOSIS — E1142 Type 2 diabetes mellitus with diabetic polyneuropathy: Secondary | ICD-10-CM | POA: Insufficient documentation

## 2024-08-01 LAB — CBC WITH DIFFERENTIAL/PLATELET
Abs Immature Granulocytes: 0.01 K/uL (ref 0.00–0.07)
Basophils Absolute: 0 K/uL (ref 0.0–0.1)
Basophils Relative: 0 %
Eosinophils Absolute: 0 K/uL (ref 0.0–0.5)
Eosinophils Relative: 1 %
HCT: 34.2 % — ABNORMAL LOW (ref 36.0–46.0)
Hemoglobin: 11 g/dL — ABNORMAL LOW (ref 12.0–15.0)
Immature Granulocytes: 0 %
Lymphocytes Relative: 38 %
Lymphs Abs: 2.2 K/uL (ref 0.7–4.0)
MCH: 24.4 pg — ABNORMAL LOW (ref 26.0–34.0)
MCHC: 32.2 g/dL (ref 30.0–36.0)
MCV: 75.8 fL — ABNORMAL LOW (ref 80.0–100.0)
Monocytes Absolute: 0.5 K/uL (ref 0.1–1.0)
Monocytes Relative: 8 %
Neutro Abs: 3 K/uL (ref 1.7–7.7)
Neutrophils Relative %: 53 %
Platelets: 247 K/uL (ref 150–400)
RBC: 4.51 MIL/uL (ref 3.87–5.11)
RDW: 16 % — ABNORMAL HIGH (ref 11.5–15.5)
WBC: 5.8 K/uL (ref 4.0–10.5)
nRBC: 0 % (ref 0.0–0.2)

## 2024-08-01 LAB — COMPREHENSIVE METABOLIC PANEL WITH GFR
ALT: <5 U/L (ref 0–44)
AST: 17 U/L (ref 15–41)
Albumin: 3.8 g/dL (ref 3.5–5.0)
Alkaline Phosphatase: 107 U/L (ref 38–126)
Anion gap: 14 (ref 5–15)
BUN: 11 mg/dL (ref 8–23)
CO2: 27 mmol/L (ref 22–32)
Calcium: 9.7 mg/dL (ref 8.9–10.3)
Chloride: 97 mmol/L — ABNORMAL LOW (ref 98–111)
Creatinine, Ser: 0.83 mg/dL (ref 0.44–1.00)
GFR, Estimated: >60 mL/min (ref >60)
Glucose, Bld: 174 mg/dL — ABNORMAL HIGH (ref 70–99)
Potassium: 3.3 mmol/L — ABNORMAL LOW (ref 3.5–5.1)
Sodium: 138 mmol/L (ref 135–145)
Total Bilirubin: 0.3 mg/dL (ref 0.0–1.2)
Total Protein: 7.4 g/dL (ref 6.5–8.1)

## 2024-08-01 LAB — TROPONIN T, HIGH SENSITIVITY
Troponin T High Sensitivity: <15 ng/L (ref 0–19)
Troponin T High Sensitivity: <15 ng/L (ref 0–19)

## 2024-08-01 LAB — D-DIMER, QUANTITATIVE: D-Dimer, Quant: 0.94 ug{FEU}/mL — ABNORMAL HIGH (ref 0.00–0.50)

## 2024-08-01 LAB — MAGNESIUM: Magnesium: 1.8 mg/dL (ref 1.7–2.4)

## 2024-08-01 MED ORDER — IOHEXOL 350 MG/ML SOLN
80.0000 mL | Freq: Once | INTRAVENOUS | Status: AC | PRN
Start: 2024-08-01 — End: 2024-08-01
  Administered 2024-08-01: 80 mL via INTRAVENOUS

## 2024-08-01 NOTE — ED Notes (Signed)
 IV attempt x3.SABRA. x1 RAC... x2 LAC... Provider aware of the delay.SABRASABRA

## 2024-08-01 NOTE — ED Triage Notes (Signed)
 Palpitation x couple days Comes and goes Denies sob, no chest pain

## 2024-08-01 NOTE — ED Provider Notes (Signed)
 [facility] AT [facility] Provider Note  CSN: [id number] Arrival date & time: 08/01/24 1130  Chief Complaint(s) Palpitations  HPI Christine Porter is a 73 y.o. female who is here today for palpitations.  Patient reports that she has been feeling some palpitations in her chest over the last several days.  It comes and goes, no shortness of breath or pain in her chest.   Past Medical History Past Medical History:  Diagnosis Date   Asthma    Gout    Hypertension    Osteoarthritis    Pneumonia    Patient Active Problem List   Diagnosis Date Noted   Diverticulosis of colon with hemorrhage    Benign neoplasm of transverse colon    Benign neoplasm of descending colon    Benign neoplasm of rectum    GI bleed 02/07/2022   Controlled type 2 diabetes mellitus without complication, without long-term current use of insulin  (HCC) 02/07/2022   Hypokalemia 02/07/2022   Obesity (BMI 30-39.9) 02/07/2022   DDD (degenerative disc disease), cervical 06/24/2020   History of peripheral neuropathy 06/24/2020   Essential hypertension 06/24/2020   History of asthma 06/24/2020   Gouty arthritis 06/24/2020   Home Medication(s) Prior to Admission medications   Medication Sig Start Date End Date Taking? Authorizing Provider  albuterol  (PROVENTIL ) (2.5 MG/3ML) 0.083% nebulizer solution Take 3 mLs (2.5 mg total) by nebulization every 6 (six) hours as needed for wheezing or shortness of breath. 11/06/18   Van Knee, MD  albuterol  (VENTOLIN  HFA) 108 (90 Base) MCG/ACT inhaler Inhale 1 puff into the lungs every 6 (six) hours as needed for wheezing or shortness of breath. 08/10/21 02/07/22  Joesph Shaver Scales, PA-C  allopurinol (ZYLOPRIM) 100 MG tablet Take 100 mg by mouth daily as needed (For gout). 10/30/18   [provider]  amLODipine  (NORVASC ) 10 MG tablet Take 1 tablet by mouth daily. 03/24/21   [provider]  baclofen (LIORESAL) 10 MG tablet Take  10 mg by mouth 3 (three) times daily as needed. 10/31/22   [provider]  budesonide -formoterol  (SYMBICORT ) 160-4.5 MCG/ACT inhaler Inhale 2 puffs into the lungs 2 (two) times daily. Patient taking differently: Inhale 2 puffs into the lungs daily as needed (For shortness of breath). 08/10/21   Joesph Shaver Scales, PA-C  citalopram  (CELEXA ) 10 MG tablet Take 1 tablet (10 mg total) by mouth daily. 02/11/22   Drusilla Sabas RAMAN, MD  furosemide  (LASIX ) 20 MG tablet Take 20 mg by mouth as needed for fluid.    [provider]  hydrOXYzine (VISTARIL) 25 MG capsule Take 50 mg by mouth at bedtime as needed. 10/25/22   [provider]  meloxicam (MOBIC) 7.5 MG tablet Take 7.5 mg by mouth daily. 11/01/22   [provider]  ondansetron  (ZOFRAN -ODT) 4 MG disintegrating tablet Take 4 mg by mouth every 8 (eight) hours as needed. 12/17/23   [provider]  oxyCODONE -acetaminophen  (PERCOCET/ROXICET) 5-325 MG tablet Take 1 tablet by mouth 4 (four) times daily as needed. 10/24/22   [provider]  predniSONE  (DELTASONE ) 10 MG tablet Take 10 mg by mouth daily. 02/05/24   [provider]  Spacer/Aero-Hold Chamber Bags MISC Use as directed for inhalation of albuterol  and Symbicort . 08/10/21   Joesph Shaver Scales, PA-C  tiZANidine  (ZANAFLEX ) 2 MG tablet Take 2 mg by mouth 2 (two) times daily as needed. 05/16/24   [provider]  tobramycin-dexamethasone  ANTHONEY) ophthalmic solution 1 drop every 6 (six) hours. 03/05/24  [provider]  traZODone  (DESYREL ) 100 MG tablet Take 100 mg by mouth at bedtime. 03/13/24   [provider]  triamcinolone  cream (KENALOG ) 0.1 % Apply 1 application topically 2 (two) times daily. Patient taking differently: Apply 1 application  topically daily as needed (For rash). 08/17/18   Wieters, Hallie C, PA-C  Vitamin D , Ergocalciferol , (DRISDOL) 1.25 MG (50000 UNIT) CAPS capsule Take 50,000 Units by mouth once a  week. 02/18/20   [provider]                                                                                                                                    Past Surgical History Past Surgical History:  Procedure Laterality Date   CESAREAN SECTION     COLONOSCOPY WITH PROPOFOL  N/A 02/08/2022   Procedure: COLONOSCOPY WITH PROPOFOL ;  Surgeon: Abran Norleen SAILOR, MD;  Location: [facility];  Service: Gastroenterology;  Laterality: N/A;   POLYPECTOMY  02/08/2022   Procedure: POLYPECTOMY;  Surgeon: Abran Norleen SAILOR, MD;  Location: [facility];  Service: Gastroenterology;;   Family History Family History  Problem Relation Age of Onset   Breast cancer Daughter     Social History Social History   Tobacco Use   Smoking status: Never   Smokeless tobacco: Never  Vaping Use   Vaping status: Never Used  Substance Use Topics   Alcohol use: No   Drug use: No   Allergies Codeine and Gabapentin  Review of Systems Review of Systems  Physical Exam Vital Signs  I have reviewed the triage vital signs BP 132/78 (BP Location: Right Arm)   Pulse 81   Temp 97.8 F (36.6 C)   Resp 15   SpO2 97%   Physical Exam Vitals and nursing note reviewed.  Constitutional:      Appearance: Normal appearance.  Pulmonary:     Effort: Pulmonary effort is normal.  Abdominal:     General: Abdomen is flat.     Palpations: Abdomen is soft.  Musculoskeletal:     Cervical back: Normal range of motion.  Skin:    General: Skin is warm and dry.  Neurological:     General: No focal deficit present.     Mental Status: She is alert.     ED Results and Treatments Labs (all labs ordered are listed, but only abnormal results are displayed) Labs Reviewed  COMPREHENSIVE METABOLIC PANEL WITH GFR - Abnormal; Notable for the following components:      Result Value   Potassium 3.3 (*)    Chloride 97 (*)    Glucose, Bld 174 (*)    All other components within normal limits  CBC WITH  DIFFERENTIAL/PLATELET - Abnormal; Notable for the following components:   Hemoglobin 11.0 (*)    HCT 34.2 (*)    MCV 75.8 (*)    MCH 24.4 (*)    RDW 16.0 (*)    All other components  within normal limits  D-DIMER, QUANTITATIVE - Abnormal; Notable for the following components:   D-Dimer, Quant 0.94 (*)    All other components within normal limits  MAGNESIUM   TROPONIN T, HIGH SENSITIVITY  TROPONIN T, HIGH SENSITIVITY                                                                                                                          Radiology CT Angio Chest PE W and/or Wo Contrast Result Date: 08/01/2024 CLINICAL DATA:  Palpitations for several days, intermittent, elevated D-dimer EXAM: CT ANGIOGRAPHY CHEST WITH CONTRAST TECHNIQUE: Multidetector CT imaging of the chest was performed using the standard protocol during bolus administration of intravenous contrast. Multiplanar CT image reconstructions and MIPs were obtained to evaluate the vascular anatomy. RADIATION DOSE REDUCTION: This exam was performed according to the departmental dose-optimization program which includes automated exposure control, adjustment of the mA and/or kV according to patient size and/or use of iterative reconstruction technique. CONTRAST:  80mL OMNIPAQUE  IOHEXOL  350 MG/ML SOLN COMPARISON:  08/10/2021 FINDINGS: Cardiovascular: This is a technically adequate evaluation of the pulmonary vasculature. No filling defects or pulmonary emboli. The heart is unremarkable without pericardial effusion. Minimal calcifications of the aortic valve. Normal caliber of the thoracic aorta. Atherosclerosis of the aorta and coronary vasculature. Mediastinum/Nodes: No enlarged mediastinal, hilar, or axillary lymph nodes. Thyroid  gland, trachea, and esophagus demonstrate no significant findings. Lungs/Pleura: No acute airspace disease, effusion, or pneumothorax. The central airways are patent. Upper Abdomen: No acute abnormality. Musculoskeletal:  No acute or destructive bony abnormalities. Reconstructed images demonstrate no additional findings. Review of the MIP images confirms the above findings. IMPRESSION: 1. No evidence of pulmonary embolus. 2. No acute intrathoracic process. 3. Aortic Atherosclerosis (ICD10-I70.0). Coronary artery atherosclerosis. Electronically Signed   By: Ozell Daring M.D.   On: 08/01/2024 14:43    Pertinent labs & imaging results that were available during my care of the patient were reviewed by me and considered in my medical decision making (see MDM for details).  Medications Ordered in ED Medications  iohexol  (OMNIPAQUE ) 350 MG/ML injection 80 mL (80 mLs Intravenous Contrast Given 08/01/24 1411)                                                                                                                                     Procedures Procedures  (including critical care time)  Medical Decision Making / ED  Course   This patient presents to the ED for concern of palpitations, this involves an extensive number of treatment options, and is a complaint that carries with it a high risk of complications and morbidity.  The differential diagnosis includes disease, PACs, PE, electrolyte abnormalities.  MDM: Patient overall well-appearing on exam.  When I am in the room, she has multiple PVCs.  Believe these are likely palpitations that she is experiencing.  Check some electrolytes on the patient that overall were normal.  Patient a mildly elevated D-dimer, so CTA of the chest was obtained.  This did not show evidence of any pulmonary embolus.  With the patient's reassuring workup, she is appropriate for discharge with cardiology follow-up.  Will discharge patient.   Additional history obtained: -Additional history obtained from daughter at bedside -External records from outside source obtained and reviewed including: Chart review including previous notes, labs, imaging, consultation notes   Lab Tests: -I  ordered, reviewed, and interpreted labs.   The pertinent results include:   Labs Reviewed  COMPREHENSIVE METABOLIC PANEL WITH GFR - Abnormal; Notable for the following components:      Result Value   Potassium 3.3 (*)    Chloride 97 (*)    Glucose, Bld 174 (*)    All other components within normal limits  CBC WITH DIFFERENTIAL/PLATELET - Abnormal; Notable for the following components:   Hemoglobin 11.0 (*)    HCT 34.2 (*)    MCV 75.8 (*)    MCH 24.4 (*)    RDW 16.0 (*)    All other components within normal limits  D-DIMER, QUANTITATIVE - Abnormal; Notable for the following components:   D-Dimer, Quant 0.94 (*)    All other components within normal limits  MAGNESIUM   TROPONIN T, HIGH SENSITIVITY  TROPONIN T, HIGH SENSITIVITY      EKG my independent review of the patient's EKG shows no ST segment depressions or elevations, no T wave inversions, no evidence of acute ischemia.  EKG Interpretation Date/Time:  Friday August 01 2024 11:37:25 EDT Ventricular Rate:  99 PR Interval:  153 QRS Duration:  75 QT Interval:  328 QTC Calculation: 421 R Axis:   -5  Text Interpretation: Sinus tachycardia Atrial premature complex Left atrial enlargement Low voltage, precordial leads Borderline repolarization abnormality Confirmed by Mannie Pac [phone]) on 08/01/2024 3:14:17 PM         Imaging Studies ordered: I ordered imaging studies including CTA chest I independently visualized and interpreted imaging. I agree with the radiologist interpretation   Medicines ordered and prescription drug management: Meds ordered this encounter  Medications   iohexol  (OMNIPAQUE ) 350 MG/ML injection 80 mL    -I have reviewed the patients home medicines and have made adjustments as needed  The patient was maintained on a cardiac monitor.  I personally viewed and interpreted the cardiac monitored which showed an underlying rhythm of: Normal sinus rhythm, occasional PVCs  Social Determinants of  Health:  Factors impacting patients care include: Lack of access to primary care   Reevaluation: After the interventions noted above, I reevaluated the patient and found that they have :improved  Co morbidities that complicate the patient evaluation  Past Medical History:  Diagnosis Date   Asthma    Gout    Hypertension    Osteoarthritis    Pneumonia       Dispostion: I considered admission for this patient, however with her reassuring workup she is appropriate for discharge.     Final Clinical Impression(s) /  ED Diagnoses Final diagnoses:  PVC's (premature ventricular contractions)  Palpitations     @PCDICTATION @    Mannie Pac T, DO 08/01/24 1517

## 2024-08-01 NOTE — Discharge Instructions (Addendum)
 While you are in the emergency room, you had blood work done that overall was normal.  It did show that your potassium was a little bit low.  Try to eat some leafy greens, blueberries or bananas over the next few days to help increase your potassium.  Please follow-up with your regular doctor.  I have sent a referral to a cardiologist.  He will be contacted within the next few weeks for a follow-up appointment.
